# Patient Record
Sex: Male | Born: 1945
Health system: Southern US, Community
[De-identification: ages and names within clinical notes are randomized; demographics above are authoritative.]

## PROBLEM LIST (undated history)

## (undated) DIAGNOSIS — E119 Type 2 diabetes mellitus without complications: Secondary | ICD-10-CM

## (undated) DIAGNOSIS — I10 Essential (primary) hypertension: Secondary | ICD-10-CM

## (undated) DIAGNOSIS — M1990 Unspecified osteoarthritis, unspecified site: Secondary | ICD-10-CM

## (undated) HISTORY — PX: CATARACT EXTRACTION: SUR2

---

## 2005-12-01 ENCOUNTER — Ambulatory Visit (HOSPITAL_COMMUNITY): Admission: RE | Admit: 2005-12-01 | Discharge: 2005-12-01 | Payer: Self-pay | Admitting: Family Medicine

## 2006-01-26 ENCOUNTER — Encounter: Admission: RE | Admit: 2006-01-26 | Discharge: 2006-01-26 | Payer: Self-pay | Admitting: Family Medicine

## 2006-02-20 ENCOUNTER — Inpatient Hospital Stay (HOSPITAL_COMMUNITY): Admission: RE | Admit: 2006-02-20 | Discharge: 2006-02-24 | Payer: Self-pay | Admitting: Orthopedic Surgery

## 2006-05-06 ENCOUNTER — Encounter: Admission: RE | Admit: 2006-05-06 | Discharge: 2006-05-06 | Payer: Self-pay | Admitting: Orthopedic Surgery

## 2006-09-06 ENCOUNTER — Encounter: Admission: RE | Admit: 2006-09-06 | Discharge: 2006-09-06 | Payer: Self-pay | Admitting: Orthopedic Surgery

## 2007-02-03 ENCOUNTER — Inpatient Hospital Stay (HOSPITAL_COMMUNITY): Admission: EM | Admit: 2007-02-03 | Discharge: 2007-02-05 | Payer: Self-pay | Admitting: Emergency Medicine

## 2007-02-03 ENCOUNTER — Ambulatory Visit: Payer: Self-pay | Admitting: Vascular Surgery

## 2007-02-04 ENCOUNTER — Ambulatory Visit: Payer: Self-pay | Admitting: Physical Medicine & Rehabilitation

## 2007-09-13 ENCOUNTER — Encounter: Admission: RE | Admit: 2007-09-13 | Discharge: 2007-09-13 | Payer: Self-pay | Admitting: Orthopedic Surgery

## 2008-06-06 ENCOUNTER — Encounter: Admission: RE | Admit: 2008-06-06 | Discharge: 2008-06-06 | Payer: Self-pay | Admitting: Family Medicine

## 2010-07-29 NOTE — H&P (Signed)
NAME:  James Guerrero, James Guerrero NO.:  [id number]   MEDICAL RECORD NO.:  [id number]          PATIENT TYPE:  EMS   LOCATION:  MAJO                         FACILITY:  [facility]   PHYSICIAN:  Isidor Holts, M.D.  DATE OF BIRTH:  1946-01-24   DATE OF ADMISSION:  02/02/2007  DATE OF DISCHARGE:                              HISTORY & PHYSICAL   PRIMARY MEDICAL DOCTOR:  Dr. Loleta Chance at the [facility].   CHIEF COMPLAINT:  Weakness left leg for some hours, also gait  instability.   HISTORY OF PRESENT ILLNESS:  This is a 65 year old patient.  History is  supplied by the patient, who is quite a good historian.  According to  him, he was quite well on February 01, 2007.  On February 02, 2007,  however, he had just returned from [facility] in his truck to his  home about 4:30 to 5:00 p.m. on February 02, 2007.  As he got out of the  truck, his left leg felt weak, and he was somewhat unsteady, denies  dizziness, denies visual obscuration denies dysarthria, chest pain,  shortness of breath or upper extremity weakness.  When he got into the  house, his wife was concerned about his unsteady gait and suggested  going to the [facility] which they did.  On arrival there, he  was evaluated and referred to the emergency department.   PAST MEDICAL HISTORY:  1. Type 2 diabetes mellitus.  2. Hypertension.  3. History of abscesses left arm December 2007 and March 2008, Status      post surgical I&D and debridement on both occasions.  No recurrence      since.   MEDICATION HISTORY:  1. ACTOplus met (15/850) one p.o. daily.  2. Benicar - HCT (20/12.5) one p.o. daily.  3. Glimepiride 4 mg p.o. b.i.d.  4. Morbid 15 mg p.o. daily.  5. Sular 17 mg p.o. daily.   ALLERGIES:  NO KNOWN DRUG ALLERGIES.   REVIEW OF SYSTEMS:  As per HPI and chief complaint otherwise negative.  The patient denies abdominal pain, vomiting, diarrhea, shortness of  breath or chest pain.   SOCIAL HISTORY:  The  patient is married, has three offspring.  Ex-  smoker, quit approximately one year ago.  Prior to that was smoking  about one pack of cigarettes per week for the past 4-5 years.  Denies  alcohol use or drug abuse.   FAMILY HISTORY:  The patient has two sisters and one brother, one of his  sisters is diabetic.  The patient's mother is deceased at age 73 years.  She was diabetic.  The patient's father is also deceased.  However, his  health history is unknown to the patient.   PHYSICAL EXAMINATION:  VITALS:  Temperature 98.7, pulse 93 per minute  regular, respiratory rate 18.  BP initially 179/99, rechecked 149/97,  pulse oximeter 98% on room air.  The patient does not appear to be in  obvious acute distress at time of this evaluation.  Alert,  communicative, not short of breath at rest.  HEENT:  No clinical pallor, no jaundice.  No conjunctival injection.  Throat is clear.  NECK:  Supple.  JVP not seen.  No palpable lymphadenopathy.  No palpable  goiter.  CHEST:  Clinically clear to auscultation.  No wheezes or crackles.  HEART:  Sounds 1 and 2 heard.  Regular rate and rhythm, no murmurs.  ABDOMEN:  Full, soft and nontender.  No palpable organomegaly.  No  palpable masses.  Normal bowel sounds .  EXTREMITIES:  Lower extremity examination no pitting edema.  Palpable  peripheral pulses.  MUSCULOSKELETAL:  Unremarkable.  CENTRAL NERVOUS SYSTEM:  Cranial nerves appear intact.  Muscle power is  5-/4 to 4/4 bilateral lower extremities.  However, the patient has  significant past pointing/finger-to-nose ataxia on the left and also has  incoordination on heel-to-shin test on the left lower extremity.  Left  plantar response is upgoing.   INVESTIGATIONS:  CBC:  WBC 7.2, hemoglobin 14.3, hematocrit 42.0,  platelets 320.  Electrolytes:  Sodium 139, potassium 3.9, chloride 105,  CO2 24.8, BUN 20, creatinine 1.3, glucose 161.  Head CT scan dated  February 02, 2007, shows no acute intracranial  findings.  A 12-lead EKG  dated February 02, 2007, shows sinus rhythm regular, 104 per minute,  normal axis, old Q-waves in V1-V3.  No acute ischemic changes.  Head CT  scan dated February 02, 2007, shows mild chronic small vessel disease,  otherwise no acute intracranial findings.   ASSESSMENT AND PLAN:  1. Clinically, the patient appears to have acute left cerebellar CVA,      although, of course, this may be TIA.  There has been reportedly      some improvement since initial evaluation.  We shall admit the      patient for telemetric monitoring, regular neuro checks, commence      antiplatelet medications, and TIA workup including homocysteine      levels, RPR, lipid profile, TSH, 2-D echocardiogram,      carotid/vertebral duplex scan and institute PT/OT.   1. Type 2 diabetes mellitus, query control.  We shall do HbA1c for      completeness.  Meanwhile, manage with diet, sliding scale insulin      coverage and pre-admission oral hypoglycemics.   1. Hypertension.  Current blood pressure is probably reasonable, in      context of recent acute CVA.  We shall monitor for now.  However,      we shall institute pre-admission antihypertensive medications.   Further management will depend on clinical course.      Isidor Holts, M.D.  Electronically Signed     CO/MEDQ  D:  02/02/2007  T:  02/03/2007  Job:  [id number]   cc:   Annia Friendly. Loleta Chance, MD

## 2010-07-29 NOTE — Discharge Summary (Signed)
NAME:  James Guerrero, James Guerrero NO.:  [id number]   MEDICAL RECORD NO.:  [id number]          PATIENT TYPE:  INP   LOCATION:  3019                         FACILITY:  [facility]   PHYSICIAN:  Hettie Holstein, D.O.    DATE OF BIRTH:  02-11-1946   DATE OF ADMISSION:  02/02/2007  DATE OF DISCHARGE:  02/05/2007                               DISCHARGE SUMMARY   PRIMARY CARE PHYSICIAN:  Annia Friendly. Loleta Chance, MD, at [facility].   FINAL DIAGNOSES:  1. Acute pontine infarct, status post inpatient stroke evaluation with      negative carotid Dopplers for internal carotid artery stenosis,      normal 2-D echocardiogram and no evidence of atrial fibrillation      during this course.  He was noted to have positive risk factors      including poorly controlled diabetes with a hemoglobin A1c of 9.5  2. Dyslipidemia with a a LDL cholesterol of 96, HDL 39 and a total      cholesterol 144, triglyceride 46.   ADDITIONAL DIAGNOSES:  1. Newly diagnosed hypothyroidism with elevated TSH.  2. Poorly controlled diabetes as described above with elevated      hemoglobin A1c.   STUDIES PERFORMED THIS ADMISSION:  He underwent a 2-D echocardiogram  with findings as described above, essentially unremarkable.  Negative  carotid duplex study.  MRI of the brain revealed an acute infarct in the  right posterior pons and moderately severe chronic microvascular changes  in the white matter.  MRA of the head was negative.   His hemoglobin A1c was 9.5 as described above.  Homocystine was 12.6.  LFTs were within normal parameters.  RPR was normal.  TSH was elevated  as described above.  Lipid profile revealed his total cholesterol to be  144, LDL of 96, HDL 39.  His basic metabolic panel revealed within  normal parameters.   MEDICATIONS ON DISCHARGE:  1. Synthroid 50 mcg daily.  This is a new medication.  2. Lantus 10 units subcu nightly.  He is instructed to check his CBG      q.a.m. and increase his Lantus by 1 unit  each night until his CBG      is less than 100.  He was read prescription.  3. Aggrenox 1 tablet plus aspirin daily for the first 10 days, then he      is instructed to stop aspirin and increase Aggrenox two b.i.d.      This was dispensed in 30-day supply.  4. Sular, he can continue at 17 mg daily as he was on prior to      arrival.  5. In addition, he can resume his Actos and metformin daily.  6. Zocor 40 mg daily is being initiated and his LFTs should be      followed up by his outpatient physician to ensure no adverse      hepatic side effects.  7. He is instructed to take over-the-counter Tylenol 650 mg 30 minutes      prior to Aggrenox for the first 10 days and he is provided with  insulin starter kit and we are asking that he be provided with      diabetes education.   He underwent a rehab medicine consultation and we felt that he would  benefit from home health physical therapy.   HISTORY OF PRESENT ILLNESS:  For full details, please refer to the H&P  as dictated by Dr. Brien Few.  However, briefly, James Guerrero is a 65 year old  male with history of left leg weakness and gait unsteadiness without  visual impairment or dysarthria according to him.  When he arrived at  his house his wife had been concerned about his unsteady gait and  suggested urgent care center at which point he was evaluated and further  referred to [facility] for admission.   HOSPITAL COURSE:  He underwent additional studies including a CT scan of  his brain that revealed some findings compatible with mild changes of  chronic small vessel disease involving the white matter, though his MRI  revealed otherwise right pontine infarct as described above.  He  underwent further stroke evaluation including swallow screening carotid  Duplex.  In addition, he underwent a rehab medicine consultation.  At  this juncture, he is still a little unsteady on his feet, otherwise he  is doing quite well  and  suitable for discharge home.  I have asked that  he contact his primary care physician, Dr. Loleta Chance, to see him this week  and perhaps schedule a neurology outpatient clinic visit over the next  month or so to assess him post stroke.      Hettie Holstein, D.O.  Electronically Signed     ESS/MEDQ  D:  02/05/2007  T:  02/06/2007  Job:  [id number]   cc:   Annia Friendly. Loleta Chance, MD  Renaye Rakers, M.D.

## 2010-08-01 NOTE — Op Note (Signed)
NAME:  James Guerrero, James Guerrero NO.:  [id number]   MEDICAL RECORD NO.:  [id number]          PATIENT TYPE:  INP   LOCATION:  5038                         FACILITY:  [facility]   PHYSICIAN:  Myrtie Neither, MD      DATE OF BIRTH:  07-23-1945   DATE OF PROCEDURE:  02/20/2006  DATE OF DISCHARGE:                               OPERATIVE REPORT   PREOPERATIVE DIAGNOSIS:  Cellulitis, multiple abscesses, left elbow.   POSTOPERATIVE DIAGNOSIS:  Cellulitis, multiple abscesses, left elbow  with intra-articular abscess, the left elbow and three abscesses within  the muscle and subcutaneous tissue.   ANESTHESIA:  General.   PROCEDURE:  Incision and drainage of multiple abscesses, debridement,  arthrotomy, left elbow with debridement and irrigation followed by wound  packing.   The patient taken operating room after given adequate preop medications,  given general anesthesia intubated.  Left arm was prepped with DuraPrep  and draped in sterile manner.  The patient had the olecranon bursal  abscess which was incised and drained and thoroughly debrided.  An  abscess was also found to the intra-articular underneath the triceps  into the elbow joint itself.  Capsule was incised and thoroughly  debrided.  Irrigation was done with the Simpulse through separate  incisions, one posterior proximal to the triceps muscle and one further  lateral, the posterior lateral aspect of the arm.  All the abscess were  incised and debrided and thoroughly irrigated.  Cultures for aerobic,  anaerobic and done and Gram stains were done.  After thorough  debridement with Simpulse and debridement of the triceps tendon itself,  wound was then packed with Betadine packing.  Bulky compressive dressing  was applied and posterior plaster splint was applied.  The patient  tolerated procedure quite well, went to recovery room in stable and  satisfactory position.      Myrtie Neither, MD  Electronically Signed     AC/MEDQ  D:  02/22/2006  T:  02/23/2006  Job:  [id number]

## 2010-08-01 NOTE — Op Note (Signed)
NAME:  James Guerrero, James Guerrero NO.:  [id number]   MEDICAL RECORD NO.:  [id number]          PATIENT TYPE:  INP   LOCATION:  5038                         FACILITY:  [facility]   PHYSICIAN:  Myrtie Neither, MD      DATE OF BIRTH:  29-May-1945   DATE OF PROCEDURE:  02/20/2006  DATE OF DISCHARGE:                               OPERATIVE REPORT   PREOPERATIVE DIAGNOSIS:  Multiple open wounds left elbow and arm.   POSTOPERATIVE DIAGNOSIS:  Multiple open wounds left elbow and arm.   PROCEDURE:  Irrigation debridement and wound closures, left elbow and  arm.   The patient taken to operating room after given adequate preop  medications and given general anesthesia and intubated.  Left upper  extremity was scrubbed with Betadine scrub and painted Betadine solution  and draped in sterile manner.  Thorough debridement skin edges were  done.  Wound themselves demonstrated good granulation and healthy  tissue, good bleeding tissue.  Debridement of the triceps tendon was  done.  Exploration of the elbow joint itself was done.  Copious and  abundant irrigation debridement of all open wounds.  This was done with  normal saline followed by antibiotic irrigation.  After adequate  debridement and irrigation, wounds were then approximated with the #2  nylon, then 2-0 nylon.  Bulky compressive dressing was applied,  fiberglass posterior splint was applied.  The patient tolerated  procedure quite well, went to recovery room in stable and satisfactory  position.      Myrtie Neither, MD  Electronically Signed     AC/MEDQ  D:  02/22/2006  T:  02/23/2006  Job:  [id number]

## 2010-08-01 NOTE — Discharge Summary (Signed)
NAME:  James Guerrero, James Guerrero NO.:  [id number]   MEDICAL RECORD NO.:  [id number]          PATIENT TYPE:  INP   LOCATION:  5038                         FACILITY:  [facility]   PHYSICIAN:  Myrtie Neither, MD      DATE OF BIRTH:  08/17/1945   DATE OF ADMISSION:  02/20/2006  DATE OF DISCHARGE:  02/24/2006                               DISCHARGE SUMMARY   ADMITTING DIAGNOSIS:  Abscess, cellulitis left upper extremity.   DISCHARGE DIAGNOSIS:  Abscess, cellulitis left upper extremity.   COMPLICATIONS:  None.   SPECIMENS:  Abscess left elbow and arm.   PERTINENT HISTORY:  This is a 65 year old male who developed pain,  swelling, fever involving his left elbow with drainage from his left  elbow after traumatic injury.  The patient was treated initially by his  primary care doctor and then referred to the ER.   PERTINENT PHYSICAL:  Left upper extremity diffusely swollen, edematous,  with purulent drainage from the left arm and elbow, edema down to the  forearm.  Limited range of motion, abscess posteriorly and over the  olecranon with purulent drainage.  Neurovascular status is intact.  The  patient had preop laboratory, CBC, CMET, EKG, chest x-ray, UA which was  found to be stable for the patient to undergo surgery.  The patient was  admitted for I&D of the left elbow on February 20, 2006.  The patient  had the wound left open.  Culture and sensitivity done.  The patient was  taken back to the operation for subsequent debridement and wound closure  done on February 22, 2006.  The patient was continued on IV antibiotics,  remained afebrile.  The patient's blood sugar was elevated and Lantus  insulin had to be used to bring this back under control.  The patient  remained afebrile, swelling subsided, and was stable enough to be  discharged and was discharged with Percocet one to two q.4 p.r.n. for  pain, continue on ice packs, use a sling, and Nerf ball to the left hand  for  gripping, and was discharged on Keflex 500 q.6 hours, Feosol 300 mg  t.i.d. to see his medical physician, Dr. Loleta Chance, for blood pressure and  his diabetes, which was scheduled for the following day.  The patient  was discharged in stable and satisfactory condition.      Myrtie Neither, MD  Electronically Signed     AC/MEDQ  D:  05/27/2006  T:  05/27/2006  Job:  [id number]

## 2010-12-23 LAB — I-STAT 8, (EC8 V) (CONVERTED LAB)
Acid-base deficit: 1
Chloride: 105
Hemoglobin: 14.3
Potassium: 3.9
Sodium: 139
TCO2: 26
pH, Ven: 7.37 — ABNORMAL HIGH

## 2010-12-23 LAB — HEPATIC FUNCTION PANEL
ALT: 21
Albumin: 3.5
Alkaline Phosphatase: 73
Total Protein: 6.3

## 2010-12-23 LAB — BASIC METABOLIC PANEL
BUN: 16
Calcium: 9.2
Chloride: 107
Creatinine, Ser: 1.35
GFR calc Af Amer: >60
GFR calc non Af Amer: 54 — ABNORMAL LOW

## 2010-12-23 LAB — POCT I-STAT CREATININE
Creatinine, Ser: 1.3
Operator id: 270111

## 2010-12-23 LAB — CBC
HCT: 38.8 — ABNORMAL LOW
MCV: 82.6
MCV: 83.8
Platelets: 309
RBC: 4.49
RBC: 4.63
WBC: 6
WBC: 7.2

## 2010-12-23 LAB — LIPID PANEL
Triglycerides: 46
VLDL: 9

## 2010-12-23 LAB — TSH: TSH: 6.44 — ABNORMAL HIGH

## 2010-12-23 LAB — DIFFERENTIAL
Eosinophils Absolute: 0.1 — ABNORMAL LOW
Eosinophils Relative: 1
Lymphocytes Relative: 24
Lymphs Abs: 1.7
Monocytes Relative: 9
Neutrophils Relative %: 66

## 2010-12-23 LAB — HEMOGLOBIN A1C
Hgb A1c MFr Bld: 9.5 — ABNORMAL HIGH
Mean Plasma Glucose: 261

## 2010-12-23 LAB — RPR: RPR Ser Ql: NONREACTIVE

## 2010-12-23 LAB — HOMOCYSTEINE: Homocysteine: 12.6

## 2014-02-28 ENCOUNTER — Ambulatory Visit
Admission: RE | Admit: 2014-02-28 | Discharge: 2014-02-28 | Disposition: A | Discharge status: Home / Self Care | Payer: Medicare Other | Source: Ambulatory Visit | Attending: Family Medicine | Admitting: Family Medicine

## 2014-02-28 ENCOUNTER — Other Ambulatory Visit: Payer: Self-pay | Admitting: Family Medicine

## 2014-02-28 DIAGNOSIS — L97519 Non-pressure chronic ulcer of other part of right foot with unspecified severity: Secondary | ICD-10-CM

## 2014-02-28 DIAGNOSIS — L819 Disorder of pigmentation, unspecified: Secondary | ICD-10-CM

## 2014-04-21 DIAGNOSIS — E08621 Diabetes mellitus due to underlying condition with foot ulcer: Secondary | ICD-10-CM | POA: Diagnosis not present

## 2014-04-21 DIAGNOSIS — E039 Hypothyroidism, unspecified: Secondary | ICD-10-CM | POA: Diagnosis not present

## 2014-04-21 DIAGNOSIS — I129 Hypertensive chronic kidney disease with stage 1 through stage 4 chronic kidney disease, or unspecified chronic kidney disease: Secondary | ICD-10-CM | POA: Diagnosis not present

## 2014-04-21 DIAGNOSIS — E1121 Type 2 diabetes mellitus with diabetic nephropathy: Secondary | ICD-10-CM | POA: Diagnosis not present

## 2014-04-21 DIAGNOSIS — I10 Essential (primary) hypertension: Secondary | ICD-10-CM | POA: Diagnosis not present

## 2014-05-18 DIAGNOSIS — M189 Osteoarthritis of first carpometacarpal joint, unspecified: Secondary | ICD-10-CM | POA: Diagnosis not present

## 2014-06-02 DIAGNOSIS — E039 Hypothyroidism, unspecified: Secondary | ICD-10-CM | POA: Diagnosis not present

## 2014-06-02 DIAGNOSIS — E1121 Type 2 diabetes mellitus with diabetic nephropathy: Secondary | ICD-10-CM | POA: Diagnosis not present

## 2014-06-02 DIAGNOSIS — E11621 Type 2 diabetes mellitus with foot ulcer: Secondary | ICD-10-CM | POA: Diagnosis not present

## 2014-06-02 DIAGNOSIS — I10 Essential (primary) hypertension: Secondary | ICD-10-CM | POA: Diagnosis not present

## 2014-06-29 DIAGNOSIS — M2041 Other hammer toe(s) (acquired), right foot: Secondary | ICD-10-CM | POA: Diagnosis not present

## 2014-06-29 DIAGNOSIS — L97519 Non-pressure chronic ulcer of other part of right foot with unspecified severity: Secondary | ICD-10-CM | POA: Diagnosis not present

## 2014-06-29 DIAGNOSIS — M79674 Pain in right toe(s): Secondary | ICD-10-CM | POA: Diagnosis not present

## 2014-07-02 DIAGNOSIS — L97519 Non-pressure chronic ulcer of other part of right foot with unspecified severity: Secondary | ICD-10-CM | POA: Diagnosis not present

## 2014-07-06 DIAGNOSIS — M2041 Other hammer toe(s) (acquired), right foot: Secondary | ICD-10-CM | POA: Diagnosis not present

## 2014-07-06 DIAGNOSIS — L97519 Non-pressure chronic ulcer of other part of right foot with unspecified severity: Secondary | ICD-10-CM | POA: Diagnosis not present

## 2014-07-14 DIAGNOSIS — I10 Essential (primary) hypertension: Secondary | ICD-10-CM | POA: Diagnosis not present

## 2014-07-14 DIAGNOSIS — E039 Hypothyroidism, unspecified: Secondary | ICD-10-CM | POA: Diagnosis not present

## 2014-07-14 DIAGNOSIS — E11621 Type 2 diabetes mellitus with foot ulcer: Secondary | ICD-10-CM | POA: Diagnosis not present

## 2014-08-01 DIAGNOSIS — L97519 Non-pressure chronic ulcer of other part of right foot with unspecified severity: Secondary | ICD-10-CM | POA: Diagnosis not present

## 2014-08-03 DIAGNOSIS — M2040 Other hammer toe(s) (acquired), unspecified foot: Secondary | ICD-10-CM | POA: Diagnosis not present

## 2014-08-25 DIAGNOSIS — I10 Essential (primary) hypertension: Secondary | ICD-10-CM | POA: Diagnosis not present

## 2014-08-25 DIAGNOSIS — E11621 Type 2 diabetes mellitus with foot ulcer: Secondary | ICD-10-CM | POA: Diagnosis not present

## 2014-08-25 DIAGNOSIS — E039 Hypothyroidism, unspecified: Secondary | ICD-10-CM | POA: Diagnosis not present

## 2014-08-25 DIAGNOSIS — E118 Type 2 diabetes mellitus with unspecified complications: Secondary | ICD-10-CM | POA: Diagnosis not present

## 2014-09-25 DIAGNOSIS — E11621 Type 2 diabetes mellitus with foot ulcer: Secondary | ICD-10-CM | POA: Diagnosis not present

## 2014-10-05 DIAGNOSIS — M2040 Other hammer toe(s) (acquired), unspecified foot: Secondary | ICD-10-CM | POA: Diagnosis not present

## 2014-11-02 DIAGNOSIS — M2041 Other hammer toe(s) (acquired), right foot: Secondary | ICD-10-CM | POA: Diagnosis not present

## 2014-12-08 DIAGNOSIS — E1122 Type 2 diabetes mellitus with diabetic chronic kidney disease: Secondary | ICD-10-CM | POA: Diagnosis not present

## 2014-12-08 DIAGNOSIS — N183 Chronic kidney disease, stage 3 (moderate): Secondary | ICD-10-CM | POA: Diagnosis not present

## 2014-12-08 DIAGNOSIS — I10 Essential (primary) hypertension: Secondary | ICD-10-CM | POA: Diagnosis not present

## 2014-12-11 DIAGNOSIS — Z961 Presence of intraocular lens: Secondary | ICD-10-CM | POA: Diagnosis not present

## 2014-12-11 DIAGNOSIS — E11351 Type 2 diabetes mellitus with proliferative diabetic retinopathy with macular edema: Secondary | ICD-10-CM | POA: Diagnosis not present

## 2014-12-11 DIAGNOSIS — H3343 Traction detachment of retina, bilateral: Secondary | ICD-10-CM | POA: Diagnosis not present

## 2014-12-11 DIAGNOSIS — H2511 Age-related nuclear cataract, right eye: Secondary | ICD-10-CM | POA: Diagnosis not present

## 2015-01-01 DIAGNOSIS — B351 Tinea unguium: Secondary | ICD-10-CM | POA: Diagnosis not present

## 2015-03-02 DIAGNOSIS — I10 Essential (primary) hypertension: Secondary | ICD-10-CM | POA: Diagnosis not present

## 2015-03-02 DIAGNOSIS — E118 Type 2 diabetes mellitus with unspecified complications: Secondary | ICD-10-CM | POA: Diagnosis not present

## 2015-03-04 DIAGNOSIS — E118 Type 2 diabetes mellitus with unspecified complications: Secondary | ICD-10-CM | POA: Diagnosis not present

## 2015-03-04 DIAGNOSIS — I10 Essential (primary) hypertension: Secondary | ICD-10-CM | POA: Diagnosis not present

## 2015-03-04 DIAGNOSIS — E039 Hypothyroidism, unspecified: Secondary | ICD-10-CM | POA: Diagnosis not present

## 2015-06-01 DIAGNOSIS — I10 Essential (primary) hypertension: Secondary | ICD-10-CM | POA: Diagnosis not present

## 2015-06-01 DIAGNOSIS — E118 Type 2 diabetes mellitus with unspecified complications: Secondary | ICD-10-CM | POA: Diagnosis not present

## 2015-06-01 DIAGNOSIS — E039 Hypothyroidism, unspecified: Secondary | ICD-10-CM | POA: Diagnosis not present

## 2015-06-11 DIAGNOSIS — H2511 Age-related nuclear cataract, right eye: Secondary | ICD-10-CM | POA: Diagnosis not present

## 2015-06-11 DIAGNOSIS — H3581 Retinal edema: Secondary | ICD-10-CM | POA: Diagnosis not present

## 2015-06-11 DIAGNOSIS — E113591 Type 2 diabetes mellitus with proliferative diabetic retinopathy without macular edema, right eye: Secondary | ICD-10-CM | POA: Diagnosis not present

## 2015-06-11 DIAGNOSIS — Z961 Presence of intraocular lens: Secondary | ICD-10-CM | POA: Diagnosis not present

## 2015-06-11 DIAGNOSIS — H3343 Traction detachment of retina, bilateral: Secondary | ICD-10-CM | POA: Diagnosis not present

## 2015-09-14 DIAGNOSIS — E039 Hypothyroidism, unspecified: Secondary | ICD-10-CM | POA: Diagnosis not present

## 2015-09-14 DIAGNOSIS — E1122 Type 2 diabetes mellitus with diabetic chronic kidney disease: Secondary | ICD-10-CM | POA: Diagnosis not present

## 2015-09-14 DIAGNOSIS — E118 Type 2 diabetes mellitus with unspecified complications: Secondary | ICD-10-CM | POA: Diagnosis not present

## 2015-09-14 DIAGNOSIS — N183 Chronic kidney disease, stage 3 (moderate): Secondary | ICD-10-CM | POA: Diagnosis not present

## 2015-09-14 DIAGNOSIS — E0810 Diabetes mellitus due to underlying condition with ketoacidosis without coma: Secondary | ICD-10-CM | POA: Diagnosis not present

## 2015-09-14 DIAGNOSIS — I10 Essential (primary) hypertension: Secondary | ICD-10-CM | POA: Diagnosis not present

## 2015-12-17 DIAGNOSIS — Z961 Presence of intraocular lens: Secondary | ICD-10-CM | POA: Diagnosis not present

## 2015-12-17 DIAGNOSIS — E113591 Type 2 diabetes mellitus with proliferative diabetic retinopathy without macular edema, right eye: Secondary | ICD-10-CM | POA: Diagnosis not present

## 2015-12-17 DIAGNOSIS — H2511 Age-related nuclear cataract, right eye: Secondary | ICD-10-CM | POA: Diagnosis not present

## 2015-12-17 DIAGNOSIS — H3343 Traction detachment of retina, bilateral: Secondary | ICD-10-CM | POA: Diagnosis not present

## 2015-12-28 DIAGNOSIS — E1122 Type 2 diabetes mellitus with diabetic chronic kidney disease: Secondary | ICD-10-CM | POA: Diagnosis not present

## 2015-12-28 DIAGNOSIS — Z23 Encounter for immunization: Secondary | ICD-10-CM | POA: Diagnosis not present

## 2015-12-28 DIAGNOSIS — N183 Chronic kidney disease, stage 3 (moderate): Secondary | ICD-10-CM | POA: Diagnosis not present

## 2015-12-28 DIAGNOSIS — E118 Type 2 diabetes mellitus with unspecified complications: Secondary | ICD-10-CM | POA: Diagnosis not present

## 2015-12-28 DIAGNOSIS — I10 Essential (primary) hypertension: Secondary | ICD-10-CM | POA: Diagnosis not present

## 2015-12-28 DIAGNOSIS — R05 Cough: Secondary | ICD-10-CM | POA: Diagnosis not present

## 2015-12-28 DIAGNOSIS — Z125 Encounter for screening for malignant neoplasm of prostate: Secondary | ICD-10-CM | POA: Diagnosis not present

## 2015-12-28 DIAGNOSIS — E0810 Diabetes mellitus due to underlying condition with ketoacidosis without coma: Secondary | ICD-10-CM | POA: Diagnosis not present

## 2016-04-04 DIAGNOSIS — E039 Hypothyroidism, unspecified: Secondary | ICD-10-CM | POA: Diagnosis not present

## 2016-04-04 DIAGNOSIS — N183 Chronic kidney disease, stage 3 (moderate): Secondary | ICD-10-CM | POA: Diagnosis not present

## 2016-04-04 DIAGNOSIS — I10 Essential (primary) hypertension: Secondary | ICD-10-CM | POA: Diagnosis not present

## 2016-04-04 DIAGNOSIS — E1122 Type 2 diabetes mellitus with diabetic chronic kidney disease: Secondary | ICD-10-CM | POA: Diagnosis not present

## 2016-06-27 DIAGNOSIS — E118 Type 2 diabetes mellitus with unspecified complications: Secondary | ICD-10-CM | POA: Diagnosis not present

## 2016-06-27 DIAGNOSIS — Z6826 Body mass index (BMI) 26.0-26.9, adult: Secondary | ICD-10-CM | POA: Diagnosis not present

## 2016-06-27 DIAGNOSIS — I10 Essential (primary) hypertension: Secondary | ICD-10-CM | POA: Diagnosis not present

## 2016-06-27 DIAGNOSIS — E039 Hypothyroidism, unspecified: Secondary | ICD-10-CM | POA: Diagnosis not present

## 2016-09-29 DIAGNOSIS — E113591 Type 2 diabetes mellitus with proliferative diabetic retinopathy without macular edema, right eye: Secondary | ICD-10-CM | POA: Diagnosis not present

## 2016-09-29 DIAGNOSIS — Z961 Presence of intraocular lens: Secondary | ICD-10-CM | POA: Diagnosis not present

## 2016-09-29 DIAGNOSIS — H3343 Traction detachment of retina, bilateral: Secondary | ICD-10-CM | POA: Diagnosis not present

## 2016-09-29 DIAGNOSIS — H2511 Age-related nuclear cataract, right eye: Secondary | ICD-10-CM | POA: Diagnosis not present

## 2016-10-31 DIAGNOSIS — N183 Chronic kidney disease, stage 3 (moderate): Secondary | ICD-10-CM | POA: Diagnosis not present

## 2016-10-31 DIAGNOSIS — E1122 Type 2 diabetes mellitus with diabetic chronic kidney disease: Secondary | ICD-10-CM | POA: Diagnosis not present

## 2016-10-31 DIAGNOSIS — E039 Hypothyroidism, unspecified: Secondary | ICD-10-CM | POA: Diagnosis not present

## 2016-11-03 DIAGNOSIS — N183 Chronic kidney disease, stage 3 (moderate): Secondary | ICD-10-CM | POA: Diagnosis not present

## 2016-11-03 DIAGNOSIS — E039 Hypothyroidism, unspecified: Secondary | ICD-10-CM | POA: Diagnosis not present

## 2016-11-03 DIAGNOSIS — E1122 Type 2 diabetes mellitus with diabetic chronic kidney disease: Secondary | ICD-10-CM | POA: Diagnosis not present

## 2017-01-15 DIAGNOSIS — Z23 Encounter for immunization: Secondary | ICD-10-CM | POA: Diagnosis not present

## 2017-02-13 DIAGNOSIS — N183 Chronic kidney disease, stage 3 (moderate): Secondary | ICD-10-CM | POA: Diagnosis not present

## 2017-02-13 DIAGNOSIS — I10 Essential (primary) hypertension: Secondary | ICD-10-CM | POA: Diagnosis not present

## 2017-02-13 DIAGNOSIS — E118 Type 2 diabetes mellitus with unspecified complications: Secondary | ICD-10-CM | POA: Diagnosis not present

## 2017-02-13 DIAGNOSIS — E039 Hypothyroidism, unspecified: Secondary | ICD-10-CM | POA: Diagnosis not present

## 2017-02-13 DIAGNOSIS — E1122 Type 2 diabetes mellitus with diabetic chronic kidney disease: Secondary | ICD-10-CM | POA: Diagnosis not present

## 2017-04-07 DIAGNOSIS — H524 Presbyopia: Secondary | ICD-10-CM | POA: Diagnosis not present

## 2017-04-07 DIAGNOSIS — H5202 Hypermetropia, left eye: Secondary | ICD-10-CM | POA: Diagnosis not present

## 2017-05-14 DIAGNOSIS — Z961 Presence of intraocular lens: Secondary | ICD-10-CM | POA: Diagnosis not present

## 2017-05-14 DIAGNOSIS — Z7984 Long term (current) use of oral hypoglycemic drugs: Secondary | ICD-10-CM | POA: Diagnosis not present

## 2017-05-14 DIAGNOSIS — E113591 Type 2 diabetes mellitus with proliferative diabetic retinopathy without macular edema, right eye: Secondary | ICD-10-CM | POA: Diagnosis not present

## 2017-05-14 DIAGNOSIS — H3343 Traction detachment of retina, bilateral: Secondary | ICD-10-CM | POA: Diagnosis not present

## 2017-05-14 DIAGNOSIS — H2511 Age-related nuclear cataract, right eye: Secondary | ICD-10-CM | POA: Diagnosis not present

## 2017-06-19 DIAGNOSIS — E1165 Type 2 diabetes mellitus with hyperglycemia: Secondary | ICD-10-CM | POA: Diagnosis not present

## 2017-06-19 DIAGNOSIS — E039 Hypothyroidism, unspecified: Secondary | ICD-10-CM | POA: Diagnosis not present

## 2017-06-19 DIAGNOSIS — E1169 Type 2 diabetes mellitus with other specified complication: Secondary | ICD-10-CM | POA: Diagnosis not present

## 2017-06-19 DIAGNOSIS — I10 Essential (primary) hypertension: Secondary | ICD-10-CM | POA: Diagnosis not present

## 2017-10-02 DIAGNOSIS — I10 Essential (primary) hypertension: Secondary | ICD-10-CM | POA: Diagnosis not present

## 2017-10-02 DIAGNOSIS — N183 Chronic kidney disease, stage 3 (moderate): Secondary | ICD-10-CM | POA: Diagnosis not present

## 2017-10-02 DIAGNOSIS — M25562 Pain in left knee: Secondary | ICD-10-CM | POA: Diagnosis not present

## 2017-10-02 DIAGNOSIS — E1122 Type 2 diabetes mellitus with diabetic chronic kidney disease: Secondary | ICD-10-CM | POA: Diagnosis not present

## 2017-10-02 DIAGNOSIS — E039 Hypothyroidism, unspecified: Secondary | ICD-10-CM | POA: Diagnosis not present

## 2017-10-02 DIAGNOSIS — E118 Type 2 diabetes mellitus with unspecified complications: Secondary | ICD-10-CM | POA: Diagnosis not present

## 2017-10-02 DIAGNOSIS — E785 Hyperlipidemia, unspecified: Secondary | ICD-10-CM | POA: Diagnosis not present

## 2017-10-02 DIAGNOSIS — E1169 Type 2 diabetes mellitus with other specified complication: Secondary | ICD-10-CM | POA: Diagnosis not present

## 2017-10-02 DIAGNOSIS — E1165 Type 2 diabetes mellitus with hyperglycemia: Secondary | ICD-10-CM | POA: Diagnosis not present

## 2017-10-15 ENCOUNTER — Emergency Department (HOSPITAL_COMMUNITY)
Admission: EM | Admit: 2017-10-15 | Discharge: 2017-10-15 | Disposition: A | Discharge status: Home / Self Care | Payer: Medicare HMO | Attending: Emergency Medicine | Admitting: Emergency Medicine

## 2017-10-15 ENCOUNTER — Other Ambulatory Visit: Payer: Self-pay

## 2017-10-15 ENCOUNTER — Encounter (HOSPITAL_COMMUNITY): Payer: Self-pay

## 2017-10-15 ENCOUNTER — Emergency Department (HOSPITAL_COMMUNITY): Payer: Medicare HMO

## 2017-10-15 DIAGNOSIS — Z7982 Long term (current) use of aspirin: Secondary | ICD-10-CM | POA: Insufficient documentation

## 2017-10-15 DIAGNOSIS — Z79899 Other long term (current) drug therapy: Secondary | ICD-10-CM | POA: Diagnosis not present

## 2017-10-15 DIAGNOSIS — Z794 Long term (current) use of insulin: Secondary | ICD-10-CM | POA: Diagnosis not present

## 2017-10-15 DIAGNOSIS — M545 Low back pain: Secondary | ICD-10-CM | POA: Diagnosis not present

## 2017-10-15 DIAGNOSIS — M25562 Pain in left knee: Secondary | ICD-10-CM | POA: Diagnosis not present

## 2017-10-15 DIAGNOSIS — Z87891 Personal history of nicotine dependence: Secondary | ICD-10-CM | POA: Diagnosis not present

## 2017-10-15 DIAGNOSIS — M25462 Effusion, left knee: Secondary | ICD-10-CM | POA: Diagnosis not present

## 2017-10-15 DIAGNOSIS — E119 Type 2 diabetes mellitus without complications: Secondary | ICD-10-CM | POA: Diagnosis not present

## 2017-10-15 DIAGNOSIS — R52 Pain, unspecified: Secondary | ICD-10-CM | POA: Diagnosis not present

## 2017-10-15 DIAGNOSIS — M5489 Other dorsalgia: Secondary | ICD-10-CM | POA: Diagnosis not present

## 2017-10-15 DIAGNOSIS — I10 Essential (primary) hypertension: Secondary | ICD-10-CM | POA: Insufficient documentation

## 2017-10-15 DIAGNOSIS — M25461 Effusion, right knee: Secondary | ICD-10-CM | POA: Diagnosis not present

## 2017-10-15 DIAGNOSIS — E1165 Type 2 diabetes mellitus with hyperglycemia: Secondary | ICD-10-CM | POA: Diagnosis not present

## 2017-10-15 DIAGNOSIS — M25561 Pain in right knee: Secondary | ICD-10-CM | POA: Insufficient documentation

## 2017-10-15 HISTORY — DX: Essential (primary) hypertension: I10

## 2017-10-15 HISTORY — DX: Type 2 diabetes mellitus without complications: E11.9
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
## 2017-10-15 NOTE — Discharge Instructions (Signed)
May take ibuprofen, naproxen, or Tylenol for pain. Follow-up with your primary care provider for any further management of this issue. Return to the ED for numbness, weakness, or any other major concerns.

## 2017-10-20 DIAGNOSIS — E1165 Type 2 diabetes mellitus with hyperglycemia: Secondary | ICD-10-CM | POA: Diagnosis not present

## 2017-10-20 DIAGNOSIS — Z6827 Body mass index (BMI) 27.0-27.9, adult: Secondary | ICD-10-CM | POA: Diagnosis not present

## 2017-10-20 DIAGNOSIS — M25569 Pain in unspecified knee: Secondary | ICD-10-CM | POA: Diagnosis not present

## 2017-11-10 DIAGNOSIS — M25562 Pain in left knee: Secondary | ICD-10-CM | POA: Diagnosis not present

## 2017-11-10 DIAGNOSIS — Z6827 Body mass index (BMI) 27.0-27.9, adult: Secondary | ICD-10-CM | POA: Diagnosis not present

## 2017-11-10 DIAGNOSIS — M25561 Pain in right knee: Secondary | ICD-10-CM | POA: Diagnosis not present

## 2017-11-29 DIAGNOSIS — Z0000 Encounter for general adult medical examination without abnormal findings: Secondary | ICD-10-CM | POA: Diagnosis not present

## 2017-11-29 DIAGNOSIS — M25561 Pain in right knee: Secondary | ICD-10-CM | POA: Diagnosis not present

## 2017-12-25 DIAGNOSIS — E1165 Type 2 diabetes mellitus with hyperglycemia: Secondary | ICD-10-CM | POA: Diagnosis not present

## 2017-12-25 DIAGNOSIS — N183 Chronic kidney disease, stage 3 (moderate): Secondary | ICD-10-CM | POA: Diagnosis not present

## 2017-12-25 DIAGNOSIS — Z6826 Body mass index (BMI) 26.0-26.9, adult: Secondary | ICD-10-CM | POA: Diagnosis not present

## 2017-12-25 DIAGNOSIS — E118 Type 2 diabetes mellitus with unspecified complications: Secondary | ICD-10-CM | POA: Diagnosis not present

## 2017-12-25 DIAGNOSIS — I10 Essential (primary) hypertension: Secondary | ICD-10-CM | POA: Diagnosis not present

## 2017-12-25 DIAGNOSIS — E1122 Type 2 diabetes mellitus with diabetic chronic kidney disease: Secondary | ICD-10-CM | POA: Diagnosis not present

## 2017-12-25 DIAGNOSIS — E039 Hypothyroidism, unspecified: Secondary | ICD-10-CM | POA: Diagnosis not present

## 2017-12-25 DIAGNOSIS — E1169 Type 2 diabetes mellitus with other specified complication: Secondary | ICD-10-CM | POA: Diagnosis not present

## 2018-01-18 DIAGNOSIS — Z961 Presence of intraocular lens: Secondary | ICD-10-CM | POA: Diagnosis not present

## 2018-01-18 DIAGNOSIS — E113591 Type 2 diabetes mellitus with proliferative diabetic retinopathy without macular edema, right eye: Secondary | ICD-10-CM | POA: Diagnosis not present

## 2018-01-18 DIAGNOSIS — H2511 Age-related nuclear cataract, right eye: Secondary | ICD-10-CM | POA: Diagnosis not present

## 2018-01-18 DIAGNOSIS — H35373 Puckering of macula, bilateral: Secondary | ICD-10-CM | POA: Diagnosis not present

## 2018-04-23 DIAGNOSIS — Z6827 Body mass index (BMI) 27.0-27.9, adult: Secondary | ICD-10-CM | POA: Diagnosis not present

## 2018-04-23 DIAGNOSIS — E1169 Type 2 diabetes mellitus with other specified complication: Secondary | ICD-10-CM | POA: Diagnosis not present

## 2018-04-23 DIAGNOSIS — I10 Essential (primary) hypertension: Secondary | ICD-10-CM | POA: Diagnosis not present

## 2018-04-23 DIAGNOSIS — E1165 Type 2 diabetes mellitus with hyperglycemia: Secondary | ICD-10-CM | POA: Diagnosis not present

## 2018-04-23 DIAGNOSIS — E039 Hypothyroidism, unspecified: Secondary | ICD-10-CM | POA: Diagnosis not present

## 2018-08-06 DIAGNOSIS — I10 Essential (primary) hypertension: Secondary | ICD-10-CM | POA: Diagnosis not present

## 2018-08-06 DIAGNOSIS — E1169 Type 2 diabetes mellitus with other specified complication: Secondary | ICD-10-CM | POA: Diagnosis not present

## 2018-08-06 DIAGNOSIS — N183 Chronic kidney disease, stage 3 (moderate): Secondary | ICD-10-CM | POA: Diagnosis not present

## 2018-08-06 DIAGNOSIS — Z7189 Other specified counseling: Secondary | ICD-10-CM | POA: Diagnosis not present

## 2018-08-06 DIAGNOSIS — E1122 Type 2 diabetes mellitus with diabetic chronic kidney disease: Secondary | ICD-10-CM | POA: Diagnosis not present

## 2018-08-06 DIAGNOSIS — E118 Type 2 diabetes mellitus with unspecified complications: Secondary | ICD-10-CM | POA: Diagnosis not present

## 2018-08-06 DIAGNOSIS — E039 Hypothyroidism, unspecified: Secondary | ICD-10-CM | POA: Diagnosis not present

## 2018-11-19 DIAGNOSIS — E1165 Type 2 diabetes mellitus with hyperglycemia: Secondary | ICD-10-CM | POA: Diagnosis not present

## 2018-11-19 DIAGNOSIS — E039 Hypothyroidism, unspecified: Secondary | ICD-10-CM | POA: Diagnosis not present

## 2018-11-19 DIAGNOSIS — I10 Essential (primary) hypertension: Secondary | ICD-10-CM | POA: Diagnosis not present

## 2018-11-19 DIAGNOSIS — N183 Chronic kidney disease, stage 3 (moderate): Secondary | ICD-10-CM | POA: Diagnosis not present

## 2018-11-19 DIAGNOSIS — E1169 Type 2 diabetes mellitus with other specified complication: Secondary | ICD-10-CM | POA: Diagnosis not present

## 2018-11-22 DIAGNOSIS — H35373 Puckering of macula, bilateral: Secondary | ICD-10-CM | POA: Diagnosis not present

## 2018-11-22 DIAGNOSIS — H2511 Age-related nuclear cataract, right eye: Secondary | ICD-10-CM | POA: Diagnosis not present

## 2018-11-22 DIAGNOSIS — H338 Other retinal detachments: Secondary | ICD-10-CM | POA: Diagnosis not present

## 2018-11-22 DIAGNOSIS — Z961 Presence of intraocular lens: Secondary | ICD-10-CM | POA: Diagnosis not present

## 2018-11-22 DIAGNOSIS — E113593 Type 2 diabetes mellitus with proliferative diabetic retinopathy without macular edema, bilateral: Secondary | ICD-10-CM | POA: Diagnosis not present

## 2018-11-29 DIAGNOSIS — R509 Fever, unspecified: Secondary | ICD-10-CM | POA: Diagnosis not present

## 2019-02-08 DIAGNOSIS — H40033 Anatomical narrow angle, bilateral: Secondary | ICD-10-CM | POA: Diagnosis not present

## 2019-02-08 DIAGNOSIS — E119 Type 2 diabetes mellitus without complications: Secondary | ICD-10-CM | POA: Diagnosis not present

## 2019-03-04 DIAGNOSIS — E1122 Type 2 diabetes mellitus with diabetic chronic kidney disease: Secondary | ICD-10-CM | POA: Diagnosis not present

## 2019-03-04 DIAGNOSIS — R509 Fever, unspecified: Secondary | ICD-10-CM | POA: Diagnosis not present

## 2019-03-04 DIAGNOSIS — E118 Type 2 diabetes mellitus with unspecified complications: Secondary | ICD-10-CM | POA: Diagnosis not present

## 2019-03-04 DIAGNOSIS — E039 Hypothyroidism, unspecified: Secondary | ICD-10-CM | POA: Diagnosis not present

## 2019-03-04 DIAGNOSIS — I10 Essential (primary) hypertension: Secondary | ICD-10-CM | POA: Diagnosis not present

## 2019-03-04 DIAGNOSIS — E1169 Type 2 diabetes mellitus with other specified complication: Secondary | ICD-10-CM | POA: Diagnosis not present
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
## 2019-03-29 DIAGNOSIS — E113591 Type 2 diabetes mellitus with proliferative diabetic retinopathy without macular edema, right eye: Secondary | ICD-10-CM | POA: Diagnosis not present

## 2019-03-29 DIAGNOSIS — Z961 Presence of intraocular lens: Secondary | ICD-10-CM | POA: Diagnosis not present

## 2019-03-29 DIAGNOSIS — H3343 Traction detachment of retina, bilateral: Secondary | ICD-10-CM | POA: Diagnosis not present

## 2019-03-29 DIAGNOSIS — H2511 Age-related nuclear cataract, right eye: Secondary | ICD-10-CM | POA: Diagnosis not present

## 2019-04-21 DIAGNOSIS — H2511 Age-related nuclear cataract, right eye: Secondary | ICD-10-CM | POA: Diagnosis not present

## 2019-04-21 DIAGNOSIS — Z20822 Contact with and (suspected) exposure to covid-19: Secondary | ICD-10-CM | POA: Diagnosis not present

## 2019-04-21 DIAGNOSIS — Z20828 Contact with and (suspected) exposure to other viral communicable diseases: Secondary | ICD-10-CM | POA: Diagnosis not present

## 2019-04-27 DIAGNOSIS — H25811 Combined forms of age-related cataract, right eye: Secondary | ICD-10-CM | POA: Diagnosis not present

## 2019-04-27 DIAGNOSIS — Z8673 Personal history of transient ischemic attack (TIA), and cerebral infarction without residual deficits: Secondary | ICD-10-CM | POA: Diagnosis not present

## 2019-04-27 DIAGNOSIS — Z9842 Cataract extraction status, left eye: Secondary | ICD-10-CM | POA: Diagnosis not present

## 2019-04-27 DIAGNOSIS — I10 Essential (primary) hypertension: Secondary | ICD-10-CM | POA: Diagnosis not present

## 2019-04-27 DIAGNOSIS — E1136 Type 2 diabetes mellitus with diabetic cataract: Secondary | ICD-10-CM | POA: Diagnosis not present

## 2019-04-27 DIAGNOSIS — Z7984 Long term (current) use of oral hypoglycemic drugs: Secondary | ICD-10-CM | POA: Diagnosis not present

## 2019-04-27 DIAGNOSIS — Z87891 Personal history of nicotine dependence: Secondary | ICD-10-CM | POA: Diagnosis not present

## 2019-04-27 DIAGNOSIS — E039 Hypothyroidism, unspecified: Secondary | ICD-10-CM | POA: Diagnosis not present

## 2019-06-10 DIAGNOSIS — E1169 Type 2 diabetes mellitus with other specified complication: Secondary | ICD-10-CM | POA: Diagnosis not present

## 2019-06-10 DIAGNOSIS — I10 Essential (primary) hypertension: Secondary | ICD-10-CM | POA: Diagnosis not present

## 2019-06-10 DIAGNOSIS — E039 Hypothyroidism, unspecified: Secondary | ICD-10-CM | POA: Diagnosis not present

## 2019-06-10 DIAGNOSIS — N183 Chronic kidney disease, stage 3 unspecified: Secondary | ICD-10-CM | POA: Diagnosis not present

## 2019-07-14 DIAGNOSIS — N183 Chronic kidney disease, stage 3 unspecified: Secondary | ICD-10-CM | POA: Diagnosis not present

## 2019-07-14 DIAGNOSIS — E039 Hypothyroidism, unspecified: Secondary | ICD-10-CM | POA: Diagnosis not present

## 2019-07-14 DIAGNOSIS — I129 Hypertensive chronic kidney disease with stage 1 through stage 4 chronic kidney disease, or unspecified chronic kidney disease: Secondary | ICD-10-CM | POA: Diagnosis not present

## 2019-07-14 DIAGNOSIS — E1122 Type 2 diabetes mellitus with diabetic chronic kidney disease: Secondary | ICD-10-CM | POA: Diagnosis not present

## 2019-09-12 DIAGNOSIS — M10042 Idiopathic gout, left hand: Secondary | ICD-10-CM | POA: Diagnosis not present

## 2019-09-12 DIAGNOSIS — Z0000 Encounter for general adult medical examination without abnormal findings: Secondary | ICD-10-CM | POA: Diagnosis not present

## 2019-09-19 DIAGNOSIS — Z6823 Body mass index (BMI) 23.0-23.9, adult: Secondary | ICD-10-CM | POA: Diagnosis not present

## 2019-09-19 DIAGNOSIS — E1165 Type 2 diabetes mellitus with hyperglycemia: Secondary | ICD-10-CM | POA: Diagnosis not present

## 2019-09-19 DIAGNOSIS — M10042 Idiopathic gout, left hand: Secondary | ICD-10-CM | POA: Diagnosis not present

## 2019-09-21 DIAGNOSIS — R569 Unspecified convulsions: Secondary | ICD-10-CM | POA: Diagnosis not present

## 2019-09-21 DIAGNOSIS — R410 Disorientation, unspecified: Secondary | ICD-10-CM | POA: Diagnosis not present

## 2019-09-21 DIAGNOSIS — R58 Hemorrhage, not elsewhere classified: Secondary | ICD-10-CM | POA: Diagnosis not present

## 2019-09-21 DIAGNOSIS — R4020 Unspecified coma: Secondary | ICD-10-CM | POA: Diagnosis not present

## 2019-09-21 DIAGNOSIS — R404 Transient alteration of awareness: Secondary | ICD-10-CM | POA: Diagnosis not present

## 2019-10-13 DIAGNOSIS — E039 Hypothyroidism, unspecified: Secondary | ICD-10-CM | POA: Diagnosis not present

## 2019-10-13 DIAGNOSIS — E1122 Type 2 diabetes mellitus with diabetic chronic kidney disease: Secondary | ICD-10-CM | POA: Diagnosis not present

## 2019-10-13 DIAGNOSIS — I129 Hypertensive chronic kidney disease with stage 1 through stage 4 chronic kidney disease, or unspecified chronic kidney disease: Secondary | ICD-10-CM | POA: Diagnosis not present

## 2019-10-13 DIAGNOSIS — N1832 Chronic kidney disease, stage 3b: Secondary | ICD-10-CM | POA: Diagnosis not present

## 2019-11-14 DIAGNOSIS — E1122 Type 2 diabetes mellitus with diabetic chronic kidney disease: Secondary | ICD-10-CM | POA: Diagnosis not present

## 2019-11-14 DIAGNOSIS — E039 Hypothyroidism, unspecified: Secondary | ICD-10-CM | POA: Diagnosis not present

## 2019-11-14 DIAGNOSIS — I129 Hypertensive chronic kidney disease with stage 1 through stage 4 chronic kidney disease, or unspecified chronic kidney disease: Secondary | ICD-10-CM | POA: Diagnosis not present

## 2019-11-14 DIAGNOSIS — N1832 Chronic kidney disease, stage 3b: Secondary | ICD-10-CM | POA: Diagnosis not present

## 2019-12-14 DIAGNOSIS — N1832 Chronic kidney disease, stage 3b: Secondary | ICD-10-CM | POA: Diagnosis not present

## 2019-12-14 DIAGNOSIS — E1122 Type 2 diabetes mellitus with diabetic chronic kidney disease: Secondary | ICD-10-CM | POA: Diagnosis not present

## 2019-12-14 DIAGNOSIS — I129 Hypertensive chronic kidney disease with stage 1 through stage 4 chronic kidney disease, or unspecified chronic kidney disease: Secondary | ICD-10-CM | POA: Diagnosis not present

## 2019-12-14 DIAGNOSIS — E039 Hypothyroidism, unspecified: Secondary | ICD-10-CM | POA: Diagnosis not present

## 2020-01-06 DIAGNOSIS — E1169 Type 2 diabetes mellitus with other specified complication: Secondary | ICD-10-CM | POA: Diagnosis not present

## 2020-01-06 DIAGNOSIS — I10 Essential (primary) hypertension: Secondary | ICD-10-CM | POA: Diagnosis not present

## 2020-01-06 DIAGNOSIS — E039 Hypothyroidism, unspecified: Secondary | ICD-10-CM | POA: Diagnosis not present

## 2020-01-06 DIAGNOSIS — E1122 Type 2 diabetes mellitus with diabetic chronic kidney disease: Secondary | ICD-10-CM | POA: Diagnosis not present

## 2020-01-06 DIAGNOSIS — I129 Hypertensive chronic kidney disease with stage 1 through stage 4 chronic kidney disease, or unspecified chronic kidney disease: Secondary | ICD-10-CM | POA: Diagnosis not present

## 2020-01-12 DIAGNOSIS — H3343 Traction detachment of retina, bilateral: Secondary | ICD-10-CM | POA: Diagnosis not present

## 2020-01-12 DIAGNOSIS — E113591 Type 2 diabetes mellitus with proliferative diabetic retinopathy without macular edema, right eye: Secondary | ICD-10-CM | POA: Diagnosis not present

## 2020-01-12 DIAGNOSIS — Z961 Presence of intraocular lens: Secondary | ICD-10-CM | POA: Diagnosis not present

## 2020-02-13 DIAGNOSIS — E039 Hypothyroidism, unspecified: Secondary | ICD-10-CM | POA: Diagnosis not present

## 2020-02-13 DIAGNOSIS — E1122 Type 2 diabetes mellitus with diabetic chronic kidney disease: Secondary | ICD-10-CM | POA: Diagnosis not present

## 2020-02-13 DIAGNOSIS — I129 Hypertensive chronic kidney disease with stage 1 through stage 4 chronic kidney disease, or unspecified chronic kidney disease: Secondary | ICD-10-CM | POA: Diagnosis not present

## 2020-02-13 DIAGNOSIS — N1832 Chronic kidney disease, stage 3b: Secondary | ICD-10-CM | POA: Diagnosis not present

## 2020-03-22 DIAGNOSIS — I10 Essential (primary) hypertension: Secondary | ICD-10-CM | POA: Diagnosis not present

## 2020-03-22 DIAGNOSIS — R000 Tachycardia, unspecified: Secondary | ICD-10-CM | POA: Diagnosis not present

## 2020-03-22 DIAGNOSIS — G4489 Other headache syndrome: Secondary | ICD-10-CM | POA: Diagnosis not present

## 2020-03-22 DIAGNOSIS — R569 Unspecified convulsions: Secondary | ICD-10-CM | POA: Diagnosis not present

## 2020-03-22 DIAGNOSIS — W19XXXA Unspecified fall, initial encounter: Secondary | ICD-10-CM | POA: Diagnosis not present

## 2020-03-30 DIAGNOSIS — I10 Essential (primary) hypertension: Secondary | ICD-10-CM | POA: Diagnosis not present

## 2020-03-30 DIAGNOSIS — E1169 Type 2 diabetes mellitus with other specified complication: Secondary | ICD-10-CM | POA: Diagnosis not present

## 2020-03-30 DIAGNOSIS — E039 Hypothyroidism, unspecified: Secondary | ICD-10-CM | POA: Diagnosis not present

## 2020-03-30 DIAGNOSIS — Z794 Long term (current) use of insulin: Secondary | ICD-10-CM | POA: Diagnosis not present

## 2020-04-02 DIAGNOSIS — I10 Essential (primary) hypertension: Secondary | ICD-10-CM | POA: Diagnosis not present

## 2020-04-02 DIAGNOSIS — Z794 Long term (current) use of insulin: Secondary | ICD-10-CM | POA: Diagnosis not present

## 2020-04-02 DIAGNOSIS — E1169 Type 2 diabetes mellitus with other specified complication: Secondary | ICD-10-CM | POA: Diagnosis not present

## 2020-04-02 DIAGNOSIS — E039 Hypothyroidism, unspecified: Secondary | ICD-10-CM | POA: Diagnosis not present

## 2020-06-13 DIAGNOSIS — I129 Hypertensive chronic kidney disease with stage 1 through stage 4 chronic kidney disease, or unspecified chronic kidney disease: Secondary | ICD-10-CM | POA: Diagnosis not present

## 2020-06-13 DIAGNOSIS — E1122 Type 2 diabetes mellitus with diabetic chronic kidney disease: Secondary | ICD-10-CM | POA: Diagnosis not present

## 2020-06-13 DIAGNOSIS — N1832 Chronic kidney disease, stage 3b: Secondary | ICD-10-CM | POA: Diagnosis not present

## 2020-07-20 DIAGNOSIS — I10 Essential (primary) hypertension: Secondary | ICD-10-CM | POA: Diagnosis not present

## 2020-07-20 DIAGNOSIS — E039 Hypothyroidism, unspecified: Secondary | ICD-10-CM | POA: Diagnosis not present

## 2020-07-20 DIAGNOSIS — E1169 Type 2 diabetes mellitus with other specified complication: Secondary | ICD-10-CM | POA: Diagnosis not present

## 2020-07-20 DIAGNOSIS — M25561 Pain in right knee: Secondary | ICD-10-CM | POA: Diagnosis not present

## 2020-07-20 DIAGNOSIS — Z7189 Other specified counseling: Secondary | ICD-10-CM | POA: Diagnosis not present

## 2020-07-23 DIAGNOSIS — Z7189 Other specified counseling: Secondary | ICD-10-CM | POA: Diagnosis not present

## 2020-07-23 DIAGNOSIS — E1169 Type 2 diabetes mellitus with other specified complication: Secondary | ICD-10-CM | POA: Diagnosis not present

## 2020-07-23 DIAGNOSIS — E039 Hypothyroidism, unspecified: Secondary | ICD-10-CM | POA: Diagnosis not present

## 2020-07-23 DIAGNOSIS — I10 Essential (primary) hypertension: Secondary | ICD-10-CM | POA: Diagnosis not present

## 2020-07-23 DIAGNOSIS — M25561 Pain in right knee: Secondary | ICD-10-CM | POA: Diagnosis not present

## 2020-08-24 DIAGNOSIS — M25562 Pain in left knee: Secondary | ICD-10-CM | POA: Diagnosis not present

## 2020-08-24 DIAGNOSIS — E1169 Type 2 diabetes mellitus with other specified complication: Secondary | ICD-10-CM | POA: Diagnosis not present

## 2020-09-03 DIAGNOSIS — R0902 Hypoxemia: Secondary | ICD-10-CM | POA: Diagnosis not present

## 2020-09-03 DIAGNOSIS — I10 Essential (primary) hypertension: Secondary | ICD-10-CM | POA: Diagnosis not present

## 2020-09-03 DIAGNOSIS — F10929 Alcohol use, unspecified with intoxication, unspecified: Secondary | ICD-10-CM | POA: Diagnosis not present

## 2020-09-19 DIAGNOSIS — R569 Unspecified convulsions: Secondary | ICD-10-CM | POA: Diagnosis not present

## 2020-09-19 DIAGNOSIS — R404 Transient alteration of awareness: Secondary | ICD-10-CM | POA: Diagnosis not present

## 2020-09-19 DIAGNOSIS — R0689 Other abnormalities of breathing: Secondary | ICD-10-CM | POA: Diagnosis not present

## 2020-09-19 DIAGNOSIS — R4020 Unspecified coma: Secondary | ICD-10-CM | POA: Diagnosis not present

## 2020-09-19 DIAGNOSIS — I10 Essential (primary) hypertension: Secondary | ICD-10-CM | POA: Diagnosis not present

## 2020-10-13 DIAGNOSIS — N1832 Chronic kidney disease, stage 3b: Secondary | ICD-10-CM | POA: Diagnosis not present

## 2020-10-13 DIAGNOSIS — E039 Hypothyroidism, unspecified: Secondary | ICD-10-CM | POA: Diagnosis not present

## 2020-10-13 DIAGNOSIS — I129 Hypertensive chronic kidney disease with stage 1 through stage 4 chronic kidney disease, or unspecified chronic kidney disease: Secondary | ICD-10-CM | POA: Diagnosis not present

## 2020-10-13 DIAGNOSIS — E1122 Type 2 diabetes mellitus with diabetic chronic kidney disease: Secondary | ICD-10-CM | POA: Diagnosis not present

## 2020-10-15 DIAGNOSIS — I10 Essential (primary) hypertension: Secondary | ICD-10-CM | POA: Diagnosis not present

## 2020-10-15 DIAGNOSIS — M10072 Idiopathic gout, left ankle and foot: Secondary | ICD-10-CM | POA: Diagnosis not present

## 2020-10-15 DIAGNOSIS — M79672 Pain in left foot: Secondary | ICD-10-CM | POA: Diagnosis not present

## 2020-10-31 DIAGNOSIS — R0902 Hypoxemia: Secondary | ICD-10-CM | POA: Diagnosis not present

## 2020-10-31 DIAGNOSIS — R569 Unspecified convulsions: Secondary | ICD-10-CM | POA: Diagnosis not present

## 2020-10-31 DIAGNOSIS — R42 Dizziness and giddiness: Secondary | ICD-10-CM | POA: Diagnosis not present

## 2020-10-31 DIAGNOSIS — I10 Essential (primary) hypertension: Secondary | ICD-10-CM | POA: Diagnosis not present

## 2020-11-05 DIAGNOSIS — R569 Unspecified convulsions: Secondary | ICD-10-CM | POA: Diagnosis not present

## 2020-11-05 DIAGNOSIS — R410 Disorientation, unspecified: Secondary | ICD-10-CM | POA: Diagnosis not present

## 2020-11-05 DIAGNOSIS — I10 Essential (primary) hypertension: Secondary | ICD-10-CM | POA: Diagnosis not present

## 2020-11-05 DIAGNOSIS — R404 Transient alteration of awareness: Secondary | ICD-10-CM | POA: Diagnosis not present

## 2020-11-05 DIAGNOSIS — R000 Tachycardia, unspecified: Secondary | ICD-10-CM | POA: Diagnosis not present

## 2020-11-13 DIAGNOSIS — E039 Hypothyroidism, unspecified: Secondary | ICD-10-CM | POA: Diagnosis not present

## 2020-11-13 DIAGNOSIS — N1832 Chronic kidney disease, stage 3b: Secondary | ICD-10-CM | POA: Diagnosis not present

## 2020-11-13 DIAGNOSIS — E1122 Type 2 diabetes mellitus with diabetic chronic kidney disease: Secondary | ICD-10-CM | POA: Diagnosis not present

## 2020-11-13 DIAGNOSIS — I129 Hypertensive chronic kidney disease with stage 1 through stage 4 chronic kidney disease, or unspecified chronic kidney disease: Secondary | ICD-10-CM | POA: Diagnosis not present

## 2020-11-21 DIAGNOSIS — I959 Hypotension, unspecified: Secondary | ICD-10-CM | POA: Diagnosis not present

## 2020-11-21 DIAGNOSIS — R404 Transient alteration of awareness: Secondary | ICD-10-CM | POA: Diagnosis not present

## 2020-11-21 DIAGNOSIS — R0902 Hypoxemia: Secondary | ICD-10-CM | POA: Diagnosis not present

## 2020-12-13 DIAGNOSIS — E039 Hypothyroidism, unspecified: Secondary | ICD-10-CM | POA: Diagnosis not present

## 2020-12-13 DIAGNOSIS — N1832 Chronic kidney disease, stage 3b: Secondary | ICD-10-CM | POA: Diagnosis not present

## 2020-12-13 DIAGNOSIS — I129 Hypertensive chronic kidney disease with stage 1 through stage 4 chronic kidney disease, or unspecified chronic kidney disease: Secondary | ICD-10-CM | POA: Diagnosis not present

## 2020-12-13 DIAGNOSIS — E1122 Type 2 diabetes mellitus with diabetic chronic kidney disease: Secondary | ICD-10-CM | POA: Diagnosis not present

## 2021-01-13 DIAGNOSIS — I129 Hypertensive chronic kidney disease with stage 1 through stage 4 chronic kidney disease, or unspecified chronic kidney disease: Secondary | ICD-10-CM | POA: Diagnosis not present

## 2021-01-13 DIAGNOSIS — E1122 Type 2 diabetes mellitus with diabetic chronic kidney disease: Secondary | ICD-10-CM | POA: Diagnosis not present

## 2021-01-13 DIAGNOSIS — N1832 Chronic kidney disease, stage 3b: Secondary | ICD-10-CM | POA: Diagnosis not present

## 2021-01-13 DIAGNOSIS — E039 Hypothyroidism, unspecified: Secondary | ICD-10-CM | POA: Diagnosis not present

## 2021-01-18 DIAGNOSIS — Z794 Long term (current) use of insulin: Secondary | ICD-10-CM | POA: Diagnosis not present

## 2021-01-18 DIAGNOSIS — I10 Essential (primary) hypertension: Secondary | ICD-10-CM | POA: Diagnosis not present

## 2021-01-18 DIAGNOSIS — Z23 Encounter for immunization: Secondary | ICD-10-CM | POA: Diagnosis not present

## 2021-01-18 DIAGNOSIS — E1169 Type 2 diabetes mellitus with other specified complication: Secondary | ICD-10-CM | POA: Diagnosis not present

## 2021-01-18 DIAGNOSIS — E039 Hypothyroidism, unspecified: Secondary | ICD-10-CM | POA: Diagnosis not present

## 2021-02-18 DIAGNOSIS — E113521 Type 2 diabetes mellitus with proliferative diabetic retinopathy with traction retinal detachment involving the macula, right eye: Secondary | ICD-10-CM | POA: Diagnosis not present

## 2021-02-18 DIAGNOSIS — Z7984 Long term (current) use of oral hypoglycemic drugs: Secondary | ICD-10-CM | POA: Diagnosis not present

## 2021-02-18 DIAGNOSIS — Z794 Long term (current) use of insulin: Secondary | ICD-10-CM | POA: Diagnosis not present

## 2021-02-18 DIAGNOSIS — H3343 Traction detachment of retina, bilateral: Secondary | ICD-10-CM | POA: Diagnosis not present

## 2021-02-18 DIAGNOSIS — H3342 Traction detachment of retina, left eye: Secondary | ICD-10-CM | POA: Diagnosis not present

## 2021-02-18 DIAGNOSIS — Z961 Presence of intraocular lens: Secondary | ICD-10-CM | POA: Diagnosis not present

## 2021-03-14 DIAGNOSIS — I129 Hypertensive chronic kidney disease with stage 1 through stage 4 chronic kidney disease, or unspecified chronic kidney disease: Secondary | ICD-10-CM | POA: Diagnosis not present

## 2021-03-14 DIAGNOSIS — N1832 Chronic kidney disease, stage 3b: Secondary | ICD-10-CM | POA: Diagnosis not present

## 2021-03-14 DIAGNOSIS — E1122 Type 2 diabetes mellitus with diabetic chronic kidney disease: Secondary | ICD-10-CM | POA: Diagnosis not present

## 2021-03-14 DIAGNOSIS — E039 Hypothyroidism, unspecified: Secondary | ICD-10-CM | POA: Diagnosis not present

## 2021-03-29 DIAGNOSIS — E1169 Type 2 diabetes mellitus with other specified complication: Secondary | ICD-10-CM | POA: Diagnosis not present

## 2021-03-29 DIAGNOSIS — I10 Essential (primary) hypertension: Secondary | ICD-10-CM | POA: Diagnosis not present

## 2021-03-29 DIAGNOSIS — E039 Hypothyroidism, unspecified: Secondary | ICD-10-CM | POA: Diagnosis not present

## 2021-05-26 DIAGNOSIS — I10 Essential (primary) hypertension: Secondary | ICD-10-CM | POA: Diagnosis not present

## 2021-05-26 DIAGNOSIS — R569 Unspecified convulsions: Secondary | ICD-10-CM | POA: Diagnosis not present

## 2021-05-26 DIAGNOSIS — G4489 Other headache syndrome: Secondary | ICD-10-CM | POA: Diagnosis not present

## 2021-07-12 DIAGNOSIS — I10 Essential (primary) hypertension: Secondary | ICD-10-CM | POA: Diagnosis not present

## 2021-07-12 DIAGNOSIS — E1165 Type 2 diabetes mellitus with hyperglycemia: Secondary | ICD-10-CM | POA: Diagnosis not present

## 2021-07-12 DIAGNOSIS — M150 Primary generalized (osteo)arthritis: Secondary | ICD-10-CM | POA: Diagnosis not present

## 2021-07-12 DIAGNOSIS — E039 Hypothyroidism, unspecified: Secondary | ICD-10-CM | POA: Diagnosis not present

## 2021-08-15 DIAGNOSIS — E1122 Type 2 diabetes mellitus with diabetic chronic kidney disease: Secondary | ICD-10-CM | POA: Diagnosis not present

## 2021-08-15 DIAGNOSIS — M1990 Unspecified osteoarthritis, unspecified site: Secondary | ICD-10-CM | POA: Diagnosis not present

## 2021-08-15 DIAGNOSIS — M109 Gout, unspecified: Secondary | ICD-10-CM | POA: Diagnosis not present

## 2021-08-15 DIAGNOSIS — Z0000 Encounter for general adult medical examination without abnormal findings: Secondary | ICD-10-CM | POA: Diagnosis not present

## 2021-08-15 DIAGNOSIS — I10 Essential (primary) hypertension: Secondary | ICD-10-CM | POA: Diagnosis not present

## 2021-09-12 DIAGNOSIS — I129 Hypertensive chronic kidney disease with stage 1 through stage 4 chronic kidney disease, or unspecified chronic kidney disease: Secondary | ICD-10-CM | POA: Diagnosis not present

## 2021-09-12 DIAGNOSIS — E1122 Type 2 diabetes mellitus with diabetic chronic kidney disease: Secondary | ICD-10-CM | POA: Diagnosis not present

## 2021-10-25 DIAGNOSIS — E1169 Type 2 diabetes mellitus with other specified complication: Secondary | ICD-10-CM | POA: Diagnosis not present

## 2021-10-25 DIAGNOSIS — E039 Hypothyroidism, unspecified: Secondary | ICD-10-CM | POA: Diagnosis not present

## 2021-10-25 DIAGNOSIS — I10 Essential (primary) hypertension: Secondary | ICD-10-CM | POA: Diagnosis not present

## 2021-10-29 DIAGNOSIS — I10 Essential (primary) hypertension: Secondary | ICD-10-CM | POA: Diagnosis not present

## 2021-10-29 DIAGNOSIS — E1169 Type 2 diabetes mellitus with other specified complication: Secondary | ICD-10-CM | POA: Diagnosis not present

## 2021-11-20 DIAGNOSIS — E1169 Type 2 diabetes mellitus with other specified complication: Secondary | ICD-10-CM | POA: Diagnosis not present

## 2021-12-13 DIAGNOSIS — I129 Hypertensive chronic kidney disease with stage 1 through stage 4 chronic kidney disease, or unspecified chronic kidney disease: Secondary | ICD-10-CM | POA: Diagnosis not present

## 2021-12-13 DIAGNOSIS — E1122 Type 2 diabetes mellitus with diabetic chronic kidney disease: Secondary | ICD-10-CM | POA: Diagnosis not present

## 2021-12-13 DIAGNOSIS — N1832 Chronic kidney disease, stage 3b: Secondary | ICD-10-CM | POA: Diagnosis not present

## 2022-02-28 DIAGNOSIS — N1832 Chronic kidney disease, stage 3b: Secondary | ICD-10-CM | POA: Diagnosis not present

## 2022-02-28 DIAGNOSIS — I129 Hypertensive chronic kidney disease with stage 1 through stage 4 chronic kidney disease, or unspecified chronic kidney disease: Secondary | ICD-10-CM | POA: Diagnosis not present

## 2022-02-28 DIAGNOSIS — E039 Hypothyroidism, unspecified: Secondary | ICD-10-CM | POA: Diagnosis not present

## 2022-02-28 DIAGNOSIS — M109 Gout, unspecified: Secondary | ICD-10-CM | POA: Diagnosis not present

## 2022-02-28 DIAGNOSIS — E1122 Type 2 diabetes mellitus with diabetic chronic kidney disease: Secondary | ICD-10-CM | POA: Diagnosis not present

## 2022-03-05 DIAGNOSIS — E039 Hypothyroidism, unspecified: Secondary | ICD-10-CM | POA: Diagnosis not present

## 2022-03-05 DIAGNOSIS — E1169 Type 2 diabetes mellitus with other specified complication: Secondary | ICD-10-CM | POA: Diagnosis not present

## 2022-03-05 DIAGNOSIS — I10 Essential (primary) hypertension: Secondary | ICD-10-CM | POA: Diagnosis not present

## 2022-04-14 DIAGNOSIS — E1122 Type 2 diabetes mellitus with diabetic chronic kidney disease: Secondary | ICD-10-CM | POA: Diagnosis not present

## 2022-04-14 DIAGNOSIS — N1832 Chronic kidney disease, stage 3b: Secondary | ICD-10-CM | POA: Diagnosis not present

## 2022-04-14 DIAGNOSIS — I129 Hypertensive chronic kidney disease with stage 1 through stage 4 chronic kidney disease, or unspecified chronic kidney disease: Secondary | ICD-10-CM | POA: Diagnosis not present

## 2022-05-05 DIAGNOSIS — E113593 Type 2 diabetes mellitus with proliferative diabetic retinopathy without macular edema, bilateral: Secondary | ICD-10-CM | POA: Diagnosis not present

## 2022-05-05 DIAGNOSIS — Z7985 Long-term (current) use of injectable non-insulin antidiabetic drugs: Secondary | ICD-10-CM | POA: Diagnosis not present

## 2022-05-05 DIAGNOSIS — Z961 Presence of intraocular lens: Secondary | ICD-10-CM | POA: Diagnosis not present

## 2022-05-05 DIAGNOSIS — H3343 Traction detachment of retina, bilateral: Secondary | ICD-10-CM | POA: Diagnosis not present

## 2022-05-05 DIAGNOSIS — Z7984 Long term (current) use of oral hypoglycemic drugs: Secondary | ICD-10-CM | POA: Diagnosis not present

## 2022-05-30 DIAGNOSIS — E039 Hypothyroidism, unspecified: Secondary | ICD-10-CM | POA: Diagnosis not present

## 2022-05-30 DIAGNOSIS — I10 Essential (primary) hypertension: Secondary | ICD-10-CM | POA: Diagnosis not present

## 2022-05-30 DIAGNOSIS — E1169 Type 2 diabetes mellitus with other specified complication: Secondary | ICD-10-CM | POA: Diagnosis not present

## 2022-06-03 DIAGNOSIS — E039 Hypothyroidism, unspecified: Secondary | ICD-10-CM | POA: Diagnosis not present

## 2022-06-03 DIAGNOSIS — I10 Essential (primary) hypertension: Secondary | ICD-10-CM | POA: Diagnosis not present

## 2022-06-03 DIAGNOSIS — E1169 Type 2 diabetes mellitus with other specified complication: Secondary | ICD-10-CM | POA: Diagnosis not present

## 2022-08-14 DIAGNOSIS — E1169 Type 2 diabetes mellitus with other specified complication: Secondary | ICD-10-CM | POA: Diagnosis not present

## 2022-08-14 DIAGNOSIS — I10 Essential (primary) hypertension: Secondary | ICD-10-CM | POA: Diagnosis not present

## 2022-09-12 DIAGNOSIS — E039 Hypothyroidism, unspecified: Secondary | ICD-10-CM | POA: Diagnosis not present

## 2022-09-12 DIAGNOSIS — E1169 Type 2 diabetes mellitus with other specified complication: Secondary | ICD-10-CM | POA: Diagnosis not present

## 2022-09-12 DIAGNOSIS — M109 Gout, unspecified: Secondary | ICD-10-CM | POA: Diagnosis not present

## 2022-09-12 DIAGNOSIS — I10 Essential (primary) hypertension: Secondary | ICD-10-CM | POA: Diagnosis not present

## 2022-09-16 DIAGNOSIS — R7303 Prediabetes: Secondary | ICD-10-CM | POA: Diagnosis not present

## 2022-09-16 DIAGNOSIS — E669 Obesity, unspecified: Secondary | ICD-10-CM | POA: Diagnosis not present

## 2022-09-16 DIAGNOSIS — I10 Essential (primary) hypertension: Secondary | ICD-10-CM | POA: Diagnosis not present

## 2022-09-16 DIAGNOSIS — E039 Hypothyroidism, unspecified: Secondary | ICD-10-CM | POA: Diagnosis not present

## 2022-09-16 DIAGNOSIS — Z125 Encounter for screening for malignant neoplasm of prostate: Secondary | ICD-10-CM | POA: Diagnosis not present

## 2022-12-26 DIAGNOSIS — M109 Gout, unspecified: Secondary | ICD-10-CM | POA: Diagnosis not present

## 2022-12-26 DIAGNOSIS — N1832 Chronic kidney disease, stage 3b: Secondary | ICD-10-CM | POA: Diagnosis not present

## 2022-12-26 DIAGNOSIS — Z794 Long term (current) use of insulin: Secondary | ICD-10-CM | POA: Diagnosis not present

## 2022-12-26 DIAGNOSIS — I129 Hypertensive chronic kidney disease with stage 1 through stage 4 chronic kidney disease, or unspecified chronic kidney disease: Secondary | ICD-10-CM | POA: Diagnosis not present

## 2022-12-26 DIAGNOSIS — E1122 Type 2 diabetes mellitus with diabetic chronic kidney disease: Secondary | ICD-10-CM | POA: Diagnosis not present

## 2022-12-26 DIAGNOSIS — E1169 Type 2 diabetes mellitus with other specified complication: Secondary | ICD-10-CM | POA: Diagnosis not present

## 2022-12-26 DIAGNOSIS — E039 Hypothyroidism, unspecified: Secondary | ICD-10-CM | POA: Diagnosis not present

## 2022-12-30 DIAGNOSIS — I10 Essential (primary) hypertension: Secondary | ICD-10-CM | POA: Diagnosis not present

## 2022-12-30 DIAGNOSIS — M109 Gout, unspecified: Secondary | ICD-10-CM | POA: Diagnosis not present

## 2022-12-30 DIAGNOSIS — E039 Hypothyroidism, unspecified: Secondary | ICD-10-CM | POA: Diagnosis not present

## 2022-12-30 DIAGNOSIS — E1169 Type 2 diabetes mellitus with other specified complication: Secondary | ICD-10-CM | POA: Diagnosis not present

## 2023-02-25 DIAGNOSIS — E1169 Type 2 diabetes mellitus with other specified complication: Secondary | ICD-10-CM | POA: Diagnosis not present

## 2023-02-25 DIAGNOSIS — Z0000 Encounter for general adult medical examination without abnormal findings: Secondary | ICD-10-CM | POA: Diagnosis not present

## 2023-04-17 DIAGNOSIS — E1169 Type 2 diabetes mellitus with other specified complication: Secondary | ICD-10-CM | POA: Diagnosis not present

## 2023-04-17 DIAGNOSIS — E1165 Type 2 diabetes mellitus with hyperglycemia: Secondary | ICD-10-CM | POA: Diagnosis not present

## 2023-04-17 DIAGNOSIS — I10 Essential (primary) hypertension: Secondary | ICD-10-CM | POA: Diagnosis not present

## 2023-04-17 DIAGNOSIS — E039 Hypothyroidism, unspecified: Secondary | ICD-10-CM | POA: Diagnosis not present

## 2023-04-17 DIAGNOSIS — M109 Gout, unspecified: Secondary | ICD-10-CM | POA: Diagnosis not present

## 2023-05-07 DIAGNOSIS — Z961 Presence of intraocular lens: Secondary | ICD-10-CM | POA: Diagnosis not present

## 2023-05-07 DIAGNOSIS — E113533 Type 2 diabetes mellitus with proliferative diabetic retinopathy with traction retinal detachment not involving the macula, bilateral: Secondary | ICD-10-CM | POA: Diagnosis not present

## 2023-07-13 DIAGNOSIS — M1712 Unilateral primary osteoarthritis, left knee: Secondary | ICD-10-CM | POA: Diagnosis not present

## 2023-07-13 DIAGNOSIS — E1169 Type 2 diabetes mellitus with other specified complication: Secondary | ICD-10-CM | POA: Diagnosis not present

## 2023-07-13 DIAGNOSIS — M25561 Pain in right knee: Secondary | ICD-10-CM | POA: Diagnosis not present

## 2023-07-20 DIAGNOSIS — Z9989 Dependence on other enabling machines and devices: Secondary | ICD-10-CM | POA: Diagnosis not present

## 2023-07-20 DIAGNOSIS — M1712 Unilateral primary osteoarthritis, left knee: Secondary | ICD-10-CM | POA: Diagnosis not present

## 2023-07-20 DIAGNOSIS — E1165 Type 2 diabetes mellitus with hyperglycemia: Secondary | ICD-10-CM | POA: Diagnosis not present

## 2023-07-20 DIAGNOSIS — M25562 Pain in left knee: Secondary | ICD-10-CM | POA: Diagnosis not present

## 2023-07-27 DIAGNOSIS — E1169 Type 2 diabetes mellitus with other specified complication: Secondary | ICD-10-CM | POA: Diagnosis not present

## 2023-07-27 DIAGNOSIS — E1122 Type 2 diabetes mellitus with diabetic chronic kidney disease: Secondary | ICD-10-CM | POA: Diagnosis not present

## 2023-07-27 DIAGNOSIS — Z9989 Dependence on other enabling machines and devices: Secondary | ICD-10-CM | POA: Diagnosis not present

## 2023-07-27 DIAGNOSIS — M1712 Unilateral primary osteoarthritis, left knee: Secondary | ICD-10-CM | POA: Diagnosis not present

## 2023-07-27 DIAGNOSIS — N189 Chronic kidney disease, unspecified: Secondary | ICD-10-CM | POA: Diagnosis not present

## 2023-07-27 DIAGNOSIS — M109 Gout, unspecified: Secondary | ICD-10-CM | POA: Diagnosis not present

## 2023-07-27 DIAGNOSIS — M25562 Pain in left knee: Secondary | ICD-10-CM | POA: Diagnosis not present

## 2023-07-27 DIAGNOSIS — M79645 Pain in left finger(s): Secondary | ICD-10-CM | POA: Diagnosis not present

## 2023-07-27 DIAGNOSIS — Z794 Long term (current) use of insulin: Secondary | ICD-10-CM | POA: Diagnosis not present

## 2023-07-27 DIAGNOSIS — M79671 Pain in right foot: Secondary | ICD-10-CM | POA: Diagnosis not present

## 2023-08-31 DIAGNOSIS — E1122 Type 2 diabetes mellitus with diabetic chronic kidney disease: Secondary | ICD-10-CM | POA: Diagnosis not present

## 2023-08-31 DIAGNOSIS — N1832 Chronic kidney disease, stage 3b: Secondary | ICD-10-CM | POA: Diagnosis not present

## 2023-08-31 DIAGNOSIS — Z794 Long term (current) use of insulin: Secondary | ICD-10-CM | POA: Diagnosis not present

## 2023-09-28 DIAGNOSIS — E11649 Type 2 diabetes mellitus with hypoglycemia without coma: Secondary | ICD-10-CM | POA: Diagnosis not present

## 2023-09-28 DIAGNOSIS — K4090 Unilateral inguinal hernia, without obstruction or gangrene, not specified as recurrent: Secondary | ICD-10-CM | POA: Diagnosis not present

## 2023-10-12 DIAGNOSIS — E1165 Type 2 diabetes mellitus with hyperglycemia: Secondary | ICD-10-CM | POA: Diagnosis not present

## 2023-10-12 DIAGNOSIS — M1832 Unilateral post-traumatic osteoarthritis of first carpometacarpal joint, left hand: Secondary | ICD-10-CM | POA: Diagnosis not present

## 2023-10-12 DIAGNOSIS — N1832 Chronic kidney disease, stage 3b: Secondary | ICD-10-CM | POA: Diagnosis not present

## 2023-10-12 DIAGNOSIS — M79645 Pain in left finger(s): Secondary | ICD-10-CM | POA: Diagnosis not present

## 2023-10-12 DIAGNOSIS — Z794 Long term (current) use of insulin: Secondary | ICD-10-CM | POA: Diagnosis not present

## 2023-10-23 DIAGNOSIS — N1832 Chronic kidney disease, stage 3b: Secondary | ICD-10-CM | POA: Diagnosis not present

## 2023-10-23 DIAGNOSIS — S31829A Unspecified open wound of left buttock, initial encounter: Secondary | ICD-10-CM | POA: Diagnosis not present

## 2023-10-23 DIAGNOSIS — E1165 Type 2 diabetes mellitus with hyperglycemia: Secondary | ICD-10-CM | POA: Diagnosis not present

## 2023-10-23 DIAGNOSIS — E1122 Type 2 diabetes mellitus with diabetic chronic kidney disease: Secondary | ICD-10-CM | POA: Diagnosis not present

## 2023-10-23 DIAGNOSIS — S71002A Unspecified open wound, left hip, initial encounter: Secondary | ICD-10-CM | POA: Diagnosis not present

## 2023-10-23 DIAGNOSIS — I10 Essential (primary) hypertension: Secondary | ICD-10-CM | POA: Diagnosis not present

## 2023-11-13 DIAGNOSIS — E1165 Type 2 diabetes mellitus with hyperglycemia: Secondary | ICD-10-CM | POA: Diagnosis not present

## 2023-11-13 DIAGNOSIS — Z794 Long term (current) use of insulin: Secondary | ICD-10-CM | POA: Diagnosis not present

## 2023-11-13 DIAGNOSIS — I952 Hypotension due to drugs: Secondary | ICD-10-CM | POA: Diagnosis not present

## 2023-11-16 DIAGNOSIS — E1165 Type 2 diabetes mellitus with hyperglycemia: Secondary | ICD-10-CM | POA: Diagnosis not present

## 2023-11-16 DIAGNOSIS — E1169 Type 2 diabetes mellitus with other specified complication: Secondary | ICD-10-CM | POA: Diagnosis not present

## 2023-11-16 DIAGNOSIS — I952 Hypotension due to drugs: Secondary | ICD-10-CM | POA: Diagnosis not present

## 2023-11-16 DIAGNOSIS — S71002D Unspecified open wound, left hip, subsequent encounter: Secondary | ICD-10-CM | POA: Diagnosis not present

## 2023-11-21 ENCOUNTER — Other Ambulatory Visit: Payer: Self-pay

## 2023-11-21 ENCOUNTER — Encounter (HOSPITAL_COMMUNITY): Payer: Self-pay | Admitting: *Deleted

## 2023-11-21 ENCOUNTER — Emergency Department (HOSPITAL_COMMUNITY)

## 2023-11-21 ENCOUNTER — Emergency Department (HOSPITAL_COMMUNITY)
Admission: EM | Admit: 2023-11-21 | Discharge: 2023-11-22 | Disposition: A | Discharge status: Home / Self Care | Attending: Emergency Medicine | Admitting: Emergency Medicine

## 2023-11-21 DIAGNOSIS — M25561 Pain in right knee: Secondary | ICD-10-CM | POA: Diagnosis not present

## 2023-11-21 DIAGNOSIS — W182XXA Fall in (into) shower or empty bathtub, initial encounter: Secondary | ICD-10-CM | POA: Diagnosis not present

## 2023-11-21 DIAGNOSIS — W010XXA Fall on same level from slipping, tripping and stumbling without subsequent striking against object, initial encounter: Secondary | ICD-10-CM | POA: Insufficient documentation

## 2023-11-21 DIAGNOSIS — I10 Essential (primary) hypertension: Secondary | ICD-10-CM | POA: Insufficient documentation

## 2023-11-21 DIAGNOSIS — G319 Degenerative disease of nervous system, unspecified: Secondary | ICD-10-CM | POA: Diagnosis not present

## 2023-11-21 DIAGNOSIS — M1711 Unilateral primary osteoarthritis, right knee: Secondary | ICD-10-CM | POA: Diagnosis not present

## 2023-11-21 DIAGNOSIS — Y92002 Bathroom of unspecified non-institutional (private) residence single-family (private) house as the place of occurrence of the external cause: Secondary | ICD-10-CM | POA: Diagnosis not present

## 2023-11-21 DIAGNOSIS — E119 Type 2 diabetes mellitus without complications: Secondary | ICD-10-CM | POA: Diagnosis not present

## 2023-11-21 DIAGNOSIS — S2231XA Fracture of one rib, right side, initial encounter for closed fracture: Secondary | ICD-10-CM | POA: Insufficient documentation

## 2023-11-21 DIAGNOSIS — Z87891 Personal history of nicotine dependence: Secondary | ICD-10-CM | POA: Diagnosis not present

## 2023-11-21 DIAGNOSIS — W19XXXA Unspecified fall, initial encounter: Secondary | ICD-10-CM | POA: Diagnosis not present

## 2023-11-21 DIAGNOSIS — S80919A Unspecified superficial injury of unspecified knee, initial encounter: Secondary | ICD-10-CM | POA: Diagnosis not present

## 2023-11-21 DIAGNOSIS — S199XXA Unspecified injury of neck, initial encounter: Secondary | ICD-10-CM | POA: Diagnosis not present

## 2023-11-21 DIAGNOSIS — S0990XA Unspecified injury of head, initial encounter: Secondary | ICD-10-CM | POA: Diagnosis not present

## 2023-11-21 DIAGNOSIS — M25461 Effusion, right knee: Secondary | ICD-10-CM | POA: Diagnosis not present

## 2023-11-21 DIAGNOSIS — R0781 Pleurodynia: Secondary | ICD-10-CM | POA: Diagnosis present

## 2023-11-21 HISTORY — DX: Unspecified osteoarthritis, unspecified site: M19.90

## 2023-11-21 MED ORDER — ACETAMINOPHEN 500 MG PO TABS
1000.0000 mg | ORAL_TABLET | Freq: Once | ORAL | Status: AC
  Administered 2023-11-21: 1000 mg via ORAL
  Filled 2023-11-21: qty 2

## 2023-11-21 NOTE — ED Provider Triage Note (Signed)
 Emergency Medicine Provider Triage Evaluation Note  James Guerrero , a 78 y.o. male  was evaluated in triage.  Pt complains of mechanical fall, reports he slipped on some water in the bathtub and fell onto his right knee.  He is noting right knee pain and right sided chest wall pain that started after the fall.  He denies any shortness of breath..  Review of Systems  Positive: Right-sided chest pain, right-sided knee pain. Negative: Fever Physical Exam  There were no vitals taken for this visit. Gen:   Awake, no distress   Resp:  Normal effort  MSK:   Moves extremities without difficulty  Other:    Medical Decision Making  Medically screening exam initiated at 8:04 PM.  Appropriate orders placed.  James Guerrero was informed that the remainder of the evaluation will be completed by another provider, this initial triage assessment does not replace that evaluation, and the importance of remaining in the ED until their evaluation is complete.     James Warren SAILOR, PA-C 11/21/23 2005

## 2023-11-21 NOTE — ED Triage Notes (Signed)
 PT has been having increasing bil knee pain for the last 3 weeks.  He has been seen by his pcp, but the pain is not improving.  Today he slipped on the water in the bathroom and landed on his R side.  He went and sat down on the couch and was not able to stand up d/t pain.   Pain and swelling to R knee and R ribs.  116/70 Hr 87 Spo2 94 Cbg 164

## 2023-11-22 MED ORDER — LIDOCAINE 5 % EX PTCH: 1.0000 | MEDICATED_PATCH | CUTANEOUS | 0 refills | Status: AC

## 2023-11-22 NOTE — ED Provider Notes (Signed)
 [facility] Emergency Department Provider Note MRN:  [id number]  Arrival date & time: 11/22/23     Chief Complaint   Rib pain History of Present Illness   James Guerrero is a 78 y.o. year-old male with a history of hypertension, diabetes presenting to the ED with chief complaint of rib pain.  Patient slipped and fell and is having pain to the right lateral ribs as well as the right lateral lower rib area.  Denies any abdominal pain, no head trauma, no loss of consciousness, no neck or back pain.  Review of Systems  A thorough review of systems was obtained and all systems are negative except as noted in the HPI and PMH.   Patient's Health History    Past Medical History:  Diagnosis Date   Arthritis    Diabetes mellitus without complication (HCC)    Hypertension     Past Surgical History:  Procedure Laterality Date   CATARACT EXTRACTION      History reviewed. No pertinent family history.  Social History   Socioeconomic History   Marital status: Married    Spouse name: Not on file   Number of children: Not on file   Years of education: Not on file   Highest education level: Not on file  Occupational History   Not on file  Tobacco Use   Smoking status: Former   Smokeless tobacco: Never   Tobacco comments:    Quit a long time ago  Vaping Use   Vaping status: Never Used  Substance and Sexual Activity   Alcohol use: Not Currently   Drug use: Never   Sexual activity: Not on file  Other Topics Concern   Not on file  Social History Narrative   Not on file   Social Drivers of Health   Financial Resource Strain: Not on file  Food Insecurity: Not on file  Transportation Needs: Not on file  Physical Activity: Not on file  Stress: Not on file  Social Connections: Not on file  Intimate Partner Violence: Not on file     Physical Exam   Vitals:   11/22/23 0000 11/22/23 0015  BP: (!) 150/91 131/84  Pulse: 84 87  Resp: 18 14  Temp:     SpO2: 100% 98%    CONSTITUTIONAL: Well-appearing, NAD NEURO/PSYCH:  Alert and oriented x 3, no focal deficits EYES:  eyes equal and reactive ENT/NECK:  no LAD, no JVD CARDIO: Regular rate, well-perfused, normal S1 and S2 PULM:  CTAB no wheezing or rhonchi GI/GU:  non-distended, non-tender MSK/SPINE:  No gross deformities, no edema SKIN:  no rash, atraumatic   *Additional and/or pertinent findings included in MDM below  Diagnostic and Interventional Summary    EKG Interpretation Date/Time:    Ventricular Rate:    PR Interval:    QRS Duration:    QT Interval:    QTC Calculation:   R Axis:      Text Interpretation:         Labs Reviewed - No data to display  DG Ribs Unilateral W/Chest Right  Final Result    DG Knee Complete 4 Views Right  Final Result    CT Head Wo Contrast  Final Result    CT Cervical Spine Wo Contrast  Final Result      Medications  acetaminophen  (TYLENOL ) tablet 1,000 mg (1,000 mg Oral Given 11/21/23 2017)     Procedures  /  Critical Care Procedures  ED Course and Medical Decision  Making  Initial Impression and Ddx Mechanical fall with right knee pain, right lower lateral rib pain.  Abdomen is completely soft and nontender, vital signs are normal, doubt significant intra-abdominal injury.  Past medical/surgical history that increases complexity of ED encounter: Diabetes  Interpretation of Diagnostics I personally reviewed the Chest Xray and my interpretation is as follows: No pneumothorax, single rib fracture  Knee x-ray showing arthritis but no acute fracture  Patient Reassessment and Ultimate Disposition/Management     Appropriate for discharge.  Patient management required discussion with the following services or consulting groups:  None  Complexity of Problems Addressed Acute illness or injury that poses threat of life of bodily function  Additional Data Reviewed and Analyzed Further history obtained from: Further  history from spouse/family member  Additional Factors Impacting ED Encounter Risk Consideration of hospitalization  Ozell HERO. Theadore, MD [facility] Emergency Medicine [facility] [email or web address]  Final Clinical Impressions(s) / ED Diagnoses     ICD-10-CM   1. Closed fracture of one rib of right side, initial encounter  S22.31XA       ED Discharge Orders          Ordered    lidocaine  (LIDODERM ) 5 %  Every 24 hours        11/22/23 0026             Discharge Instructions Discussed with and Provided to Patient:    Discharge Instructions      You were evaluated in the Emergency Department and after careful evaluation, we did not find any emergent condition requiring admission or further testing in the hospital.  Your exam/testing today is overall reassuring.  You have 1 broken rib.  Use the numbing patches prescribed daily.  Use Tylenol  1000 mg every 4-6 hours.  Take deep breaths throughout the day using the incentive spirometer device as we discussed.  Please return to the Emergency Department if you experience any worsening of your condition.   Thank you for allowing us  to be a part of your care.      Theadore Ozell HERO, MD 11/22/23 [phone]

## 2023-11-22 NOTE — Discharge Instructions (Signed)
 You were evaluated in the Emergency Department and after careful evaluation, we did not find any emergent condition requiring admission or further testing in the hospital.  Your exam/testing today is overall reassuring.  You have 1 broken rib.  Use the numbing patches prescribed daily.  Use Tylenol  1000 mg every 4-6 hours.  Take deep breaths throughout the day using the incentive spirometer device as we discussed.  Please return to the Emergency Department if you experience any worsening of your condition.   Thank you for allowing us  to be a part of your care.

## 2023-11-22 NOTE — ED Notes (Signed)
 Incentive spirometry use taught to pt and pt displayed proper use to this RN.

## 2023-12-04 DIAGNOSIS — I951 Orthostatic hypotension: Secondary | ICD-10-CM | POA: Diagnosis not present

## 2023-12-04 DIAGNOSIS — S71002D Unspecified open wound, left hip, subsequent encounter: Secondary | ICD-10-CM | POA: Diagnosis not present

## 2023-12-04 DIAGNOSIS — M10041 Idiopathic gout, right hand: Secondary | ICD-10-CM | POA: Diagnosis not present

## 2023-12-04 DIAGNOSIS — E1169 Type 2 diabetes mellitus with other specified complication: Secondary | ICD-10-CM | POA: Diagnosis not present

## 2023-12-04 DIAGNOSIS — N1832 Chronic kidney disease, stage 3b: Secondary | ICD-10-CM | POA: Diagnosis not present

## 2023-12-04 DIAGNOSIS — Z23 Encounter for immunization: Secondary | ICD-10-CM | POA: Diagnosis not present

## 2023-12-06 DIAGNOSIS — E119 Type 2 diabetes mellitus without complications: Secondary | ICD-10-CM | POA: Diagnosis not present

## 2023-12-06 DIAGNOSIS — I10 Essential (primary) hypertension: Secondary | ICD-10-CM | POA: Diagnosis not present

## 2023-12-09 ENCOUNTER — Other Ambulatory Visit (HOSPITAL_COMMUNITY): Payer: Self-pay

## 2023-12-09 ENCOUNTER — Telehealth: Payer: Self-pay

## 2023-12-09 NOTE — Progress Notes (Signed)
   12/09/23  Patient ID: James Guerrero Quiet, male   DOB: April 30, 1945, 78 y.o.   MRN: [id number]  Contacted patient regarding referral for medication access from Leigh Lung, MD  Patient and provider signed PAP forms, will send completed applications back to Inocente Butcher, CPhT for Jardiance 25 mg once daily and Toujeo 20 units once daily at bedtime.   I will follow up for approval status.   Thanks,  Heather Factor, PharmD Clinical Pharmacist  [phone]

## 2023-12-09 NOTE — Telephone Encounter (Signed)
 PAP: Application for Bernadine has been submitted to [employer]), via fax

## 2023-12-09 NOTE — Progress Notes (Signed)
   12/02/23  Patient ID: James Guerrero Quiet, male   DOB: Jun 12, 1945, 78 y.o.   MRN: [id number]  Contacted patient regarding referral for medication access from Leigh Lung, MD  Contacted patient regarding medication assistance for Jardiance and Toujeo. Patient has upcoming PCP appt on 12/04/23, would like to sign PAP applications at that time. I will leave pre-filled PAP forms for his signature and follow up next week.   Thanks,  Heather Factor, PharmD Clinical Pharmacist  [phone]

## 2023-12-10 ENCOUNTER — Other Ambulatory Visit (HOSPITAL_COMMUNITY): Payer: Self-pay

## 2023-12-10 NOTE — Telephone Encounter (Signed)
 PAP: Patient assistance application for Toujeo has been approved by PAP Companies: [employer] from 12/10/2023 to 03/15/2024. Medication should be delivered to PAP Delivery: Provider's office. For further shipping updates, please contact [employer] at [phone]. Patient ID is: [id number]

## 2023-12-10 NOTE — Telephone Encounter (Signed)
 PAP: Application for Toujeo Solostar has been submitted to [employer], via fax

## 2023-12-15 NOTE — Progress Notes (Signed)
 Pharmacy Medication Assistance Program Note    12/15/2023  Patient ID: James Guerrero, male   DOB: 1945-08-08, 77 y.o.   MRN: [id number]     12/09/2023  Outreach Medication One  Initial Outreach Date (Medication One) 12/09/2023  Manufacturer Medication One [employer]  [employer] Drugs Jardiance  Dose of Jardiance 25mg   Date Application Received From Patient 12/09/2023  Application Items Received From Patient Application;Proof of Income  Date Application Received From Provider 12/09/2023  Date Application Submitted to Manufacturer 12/09/2023  Method Application Sent to Manufacturer Fax  Patient Assistance Determination Approved  Approval Start Date 12/15/2023  Approval End Date 03/15/2024  Patient Notification Method Telephone Call   Pharmacy Medication Assistance Program Note    12/15/2023  Patient ID: James Guerrero, male  DOB: Oct 20, 1945, 78 y.o.  MRN:  [id number]     12/10/2023  Outreach Medication Two  Initial Outreach Date (Medication Two) 12/10/2023  Manufacturer Medication Two [employer]  [employer] Toujeo  Dose of Toujeo u300  Type of Radiographer, therapeutic Assistance  Name of Prescriber Elna Potters  Date Application Received From Patient 12/10/2023  Application Items Received From Patient Application;Proof of Income  Date Application Received From Provider 12/10/2023  Method Application Sent to Manufacturer Fax  Date Application Submitted to Manufacturer 12/10/2023  Patient Assistance Determination Approved  Approval Start Date 12/10/2023  Patient Notification Method Telephone Call     Signature

## 2023-12-15 NOTE — Telephone Encounter (Signed)
 PAP: Patient assistance application for Jardiance has been approved by PAP Companies: [employer] from 12/15/2023 to 03/15/2024. Medication should be delivered to PAP Delivery: Home. For further shipping updates, please contact [employer] ([employer]) at [phone]. Patient ID is: no ID given

## 2023-12-23 ENCOUNTER — Other Ambulatory Visit (HOSPITAL_COMMUNITY): Payer: Self-pay

## 2023-12-25 DIAGNOSIS — M10042 Idiopathic gout, left hand: Secondary | ICD-10-CM | POA: Diagnosis not present

## 2023-12-25 DIAGNOSIS — E1122 Type 2 diabetes mellitus with diabetic chronic kidney disease: Secondary | ICD-10-CM | POA: Diagnosis not present

## 2023-12-25 DIAGNOSIS — I952 Hypotension due to drugs: Secondary | ICD-10-CM | POA: Diagnosis not present

## 2023-12-25 DIAGNOSIS — N1832 Chronic kidney disease, stage 3b: Secondary | ICD-10-CM | POA: Diagnosis not present

## 2023-12-25 DIAGNOSIS — Z794 Long term (current) use of insulin: Secondary | ICD-10-CM | POA: Diagnosis not present

## 2023-12-25 DIAGNOSIS — E1165 Type 2 diabetes mellitus with hyperglycemia: Secondary | ICD-10-CM | POA: Diagnosis not present

## 2024-01-04 DIAGNOSIS — H524 Presbyopia: Secondary | ICD-10-CM | POA: Diagnosis not present

## 2024-01-04 DIAGNOSIS — H5213 Myopia, bilateral: Secondary | ICD-10-CM | POA: Diagnosis not present

## 2024-01-06 NOTE — Progress Notes (Signed)
   12/09/23  Patient ID: Lynwood FORBES Quiet, male   DOB: [birth year], 78 y.o.   MRN: [id number]  Contacted patient regarding referral for medication access from Leigh Lung, MD  Patient is approved for Jardiance and Toujeo. There was no answer today, I was unable to leave a voicemail either. I will attempt to contact the pt again next week.   Thanks,  Heather Factor, PharmD Clinical Pharmacist  [phone]

## 2024-01-18 ENCOUNTER — Telehealth: Payer: Self-pay

## 2024-01-18 NOTE — Telephone Encounter (Addendum)
 2026 Renewal  PAP: Patient assistance application for Jardiance through [employer]) has been mailed to pt's home address on file. Provider portion of application will be faxed to provider's office.   PAP: Patient assistance application for Lantus through [employer] has been mailed to pt's home address on file. Provider portion of application will be faxed to provider's office.   Provider portion will be sent to Dr. Elna Potters ar [facility] with patient about coming to office 11/6 to sign applications and he stated he would like them mailed

## 2024-01-27 ENCOUNTER — Telehealth: Payer: Self-pay

## 2024-01-27 NOTE — Progress Notes (Addendum)
   01/27/2024  Patient ID: James Guerrero Quiet, male   DOB: 09-22-1945, 78 y.o.   MRN: [id number]    2026 Medication Assistance Renewal Application Summary:  Patient was outreached regarding medication assistance renewal for 2026. Verified address, anticipated insurance for 2026, and income has not changed. Patient remains interested in PAP for 2026 for Jardiance and Lantus (*recent change from Toujeo), no other new medications were identified for medication assistance.   Medication Assistance Findings:  Medications assistance needs identified:Jardiance 25 mg and Lantus     Additional medication assistance options reviewed with patient as warranted:  No other options identified  Plan: I will route patient assistance letter to pharmacy technician who will coordinate patient assistance program application process for medications listed above.  Pharmacy technician will assist with obtaining all required documents from both patient and provider(s) and submit application(s) once completed.    Thank you for allowing pharmacy to be a part of this patient's care.  Heather Factor, PharmD Clinical Pharmacist  [phone]

## 2024-02-01 NOTE — Telephone Encounter (Signed)
 Received patient portions of patient assistance application

## 2024-02-04 NOTE — Telephone Encounter (Signed)
 PAP: Application for Bernadine has been submitted to [employer]), via fax

## 2024-02-14 NOTE — Telephone Encounter (Signed)
 PAP: Application for Lantus has been submitted to [employer], via fax

## 2024-02-16 NOTE — Telephone Encounter (Signed)
 PAP: Patient assistance application for Lantus Solostar has been approved by PAP Companies: [employer] from 03/16/2024 to 03/15/2025. Medication should be delivered to PAP Delivery: Provider's office. For further shipping updates, please contact [employer] at [phone]. Patient ID is: [id number]

## 2024-02-16 NOTE — Telephone Encounter (Signed)
 Spoke with [employer] patient needs to send in POI of both him and his wife, made patient and wife aware

## 2024-03-02 ENCOUNTER — Telehealth: Payer: Self-pay

## 2024-03-02 NOTE — Progress Notes (Signed)
° °  03/02/2024  Patient ID: James Guerrero Quiet, male   DOB: 07/04/1945, 78 y.o.   MRN: [id number]  Called patient to follow up on 2026 PAP renewal for Jardiance, pt is already approved for Lantus. I spoke with the patient and his wife. They prefer Lantus in vials so I will f/u with [employer] PAP on formulation change. The patient also needed Jardiance refill. I called [employer] P[employer] to aid in refilling Jardiance 25 mg for patient. I requested that the shipment be expedited, as the patient is reporting being low on the medication.   I will follow up on updates next week.   Heather Factor, PharmD Clinical Pharmacist  [phone]

## 2024-03-14 NOTE — Telephone Encounter (Signed)
 PAP: Patient has been denied for patient assistance by [employer]) due to patient below income threshold for program, needs to apply for Extra Help Low Income Subsidy (LIS)   Left Voice mail for pt.  Will also follow up with pharmacist
